# Patient Record
Sex: Male | Born: 1980 | Race: Black or African American | Hispanic: No | Marital: Single | Smoking: Current every day smoker
Health system: Southern US, Community
[De-identification: ages and names within clinical notes are randomized; demographics above are authoritative.]

## PROBLEM LIST (undated history)

## (undated) DIAGNOSIS — I10 Essential (primary) hypertension: Secondary | ICD-10-CM

---

## 1998-08-29 ENCOUNTER — Emergency Department (HOSPITAL_COMMUNITY): Admission: EM | Admit: 1998-08-29 | Discharge: 1998-08-29 | Payer: Self-pay | Admitting: Emergency Medicine

## 1999-10-02 ENCOUNTER — Emergency Department (HOSPITAL_COMMUNITY): Admission: EM | Admit: 1999-10-02 | Discharge: 1999-10-02 | Payer: Self-pay | Admitting: Emergency Medicine

## 1999-10-05 ENCOUNTER — Emergency Department (HOSPITAL_COMMUNITY): Admission: EM | Admit: 1999-10-05 | Discharge: 1999-10-05 | Payer: Self-pay | Admitting: Emergency Medicine

## 1999-10-05 ENCOUNTER — Encounter: Payer: Self-pay | Admitting: Emergency Medicine

## 2001-12-12 ENCOUNTER — Emergency Department (HOSPITAL_COMMUNITY): Admission: EM | Admit: 2001-12-12 | Discharge: 2001-12-12 | Payer: Self-pay | Admitting: Emergency Medicine

## 2002-11-24 ENCOUNTER — Emergency Department (HOSPITAL_COMMUNITY): Admission: EM | Admit: 2002-11-24 | Discharge: 2002-11-24 | Payer: Self-pay | Admitting: Emergency Medicine

## 2003-05-22 ENCOUNTER — Emergency Department (HOSPITAL_COMMUNITY): Admission: EM | Admit: 2003-05-22 | Discharge: 2003-05-22 | Payer: Self-pay | Admitting: Emergency Medicine

## 2003-11-09 ENCOUNTER — Emergency Department (HOSPITAL_COMMUNITY): Admission: EM | Admit: 2003-11-09 | Discharge: 2003-11-09 | Payer: Self-pay | Admitting: *Deleted

## 2004-03-22 ENCOUNTER — Emergency Department (HOSPITAL_COMMUNITY): Admission: EM | Admit: 2004-03-22 | Discharge: 2004-03-22 | Payer: Self-pay | Admitting: *Deleted

## 2004-03-26 ENCOUNTER — Emergency Department (HOSPITAL_COMMUNITY): Admission: EM | Admit: 2004-03-26 | Discharge: 2004-03-26 | Payer: Self-pay | Admitting: Emergency Medicine

## 2006-07-19 ENCOUNTER — Emergency Department (HOSPITAL_COMMUNITY): Admission: EM | Admit: 2006-07-19 | Discharge: 2006-07-19 | Payer: Self-pay | Admitting: Emergency Medicine

## 2009-01-17 ENCOUNTER — Ambulatory Visit: Payer: Self-pay | Admitting: Diagnostic Radiology

## 2009-01-17 ENCOUNTER — Emergency Department (HOSPITAL_BASED_OUTPATIENT_CLINIC_OR_DEPARTMENT_OTHER): Admission: EM | Admit: 2009-01-17 | Discharge: 2009-01-17 | Payer: Self-pay | Admitting: Internal Medicine

## 2010-05-30 ENCOUNTER — Emergency Department (HOSPITAL_COMMUNITY): Admission: EM | Admit: 2010-05-30 | Discharge: 2010-05-30 | Payer: Self-pay | Admitting: Emergency Medicine

## 2011-02-11 ENCOUNTER — Encounter: Payer: Self-pay | Admitting: *Deleted

## 2011-02-11 ENCOUNTER — Emergency Department (HOSPITAL_BASED_OUTPATIENT_CLINIC_OR_DEPARTMENT_OTHER)
Admission: EM | Admit: 2011-02-11 | Discharge: 2011-02-11 | Disposition: A | Discharge status: Home / Self Care | Payer: Self-pay | Attending: Emergency Medicine | Admitting: Emergency Medicine

## 2011-02-11 ENCOUNTER — Emergency Department (INDEPENDENT_AMBULATORY_CARE_PROVIDER_SITE_OTHER): Payer: Self-pay

## 2011-02-11 DIAGNOSIS — M25549 Pain in joints of unspecified hand: Secondary | ICD-10-CM | POA: Insufficient documentation

## 2011-02-11 DIAGNOSIS — F17200 Nicotine dependence, unspecified, uncomplicated: Secondary | ICD-10-CM | POA: Insufficient documentation

## 2011-02-11 DIAGNOSIS — R609 Edema, unspecified: Secondary | ICD-10-CM | POA: Insufficient documentation

## 2011-02-11 DIAGNOSIS — W19XXXA Unspecified fall, initial encounter: Secondary | ICD-10-CM

## 2011-02-11 DIAGNOSIS — M25541 Pain in joints of right hand: Secondary | ICD-10-CM

## 2011-02-11 MED ORDER — IBUPROFEN 800 MG PO TABS: 800.0000 mg | ORAL_TABLET | Freq: Three times a day (TID) | ORAL | Status: AC

## 2011-02-11 MED ORDER — TRAMADOL HCL 50 MG PO TABS: 50.0000 mg | ORAL_TABLET | Freq: Four times a day (QID) | ORAL | Status: AC | PRN
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
## 2011-02-12 NOTE — ED Provider Notes (Signed)
History     Chief Complaint  Patient presents with  . Hand Injury   HPI  Pt fell yesterday and hit this back of his right hand when he hit ground.  Has pain at 3rd and 4th MCP joints.  Pain aggravated by ROM of 3rd and 4th digits and palpation.  Associated w/ edema.  No paresthesias.  Does not have pain anywhere else from fall.  History reviewed. No pertinent past medical history.  History reviewed. No pertinent past surgical history.  History reviewed. No pertinent family history.  History  Substance Use Topics  . Smoking status: Current Everyday Smoker -- 0.5 packs/day  . Smokeless tobacco: Not on file  . Alcohol Use: Yes      Review of Systems  All other systems reviewed and are negative.    Physical Exam  BP 139/87  Pulse 72  Temp(Src) 98.9 F (37.2 C) (Oral)  Resp 18  Ht 5\' 11"  (1.803 m)  Wt 210 lb (95.255 kg)  BMI 29.29 kg/m2  SpO2 99%  Physical Exam  Nursing note and vitals reviewed. Constitutional: He is oriented to person, place, and time. He appears well-developed and well-nourished. No distress.  HENT:  Head: Normocephalic and atraumatic.  Eyes:       Normal appearance  Neck: Normal range of motion.  Musculoskeletal:       Edema and tenderness of and between 3rd and 4th MCP joints of right hand. No deformity or skin changes.  Pain w/ ROM of 3rd and 4th digits.  Distal sensation intact and nml cap refill.  Normal wrist exam.   Neurological: He is alert and oriented to person, place, and time.  Psychiatric: He has a normal mood and affect. His behavior is normal.    ED Course  Procedures  MD Pt present w/ traumatic right hand pain.  Edema and tenderness and painful ROM of 3rd and 4th MCP joints.  NV intact.  Xray neg for fx/dislocation.  Pt declined splint.  Discharged home w/ ibuprofen and ultram for pain.   Recommended RICE.        Otilio Miu, [place] 02/12/11 [phone]

## 2012-08-03 ENCOUNTER — Emergency Department (HOSPITAL_BASED_OUTPATIENT_CLINIC_OR_DEPARTMENT_OTHER)
Admission: EM | Admit: 2012-08-03 | Discharge: 2012-08-03 | Disposition: A | Discharge status: Home / Self Care | Payer: Self-pay | Attending: Emergency Medicine | Admitting: Emergency Medicine

## 2012-08-03 ENCOUNTER — Emergency Department (HOSPITAL_BASED_OUTPATIENT_CLINIC_OR_DEPARTMENT_OTHER): Payer: Self-pay

## 2012-08-03 ENCOUNTER — Encounter (HOSPITAL_BASED_OUTPATIENT_CLINIC_OR_DEPARTMENT_OTHER): Payer: Self-pay

## 2012-08-03 DIAGNOSIS — Y929 Unspecified place or not applicable: Secondary | ICD-10-CM | POA: Insufficient documentation

## 2012-08-03 DIAGNOSIS — S6990XA Unspecified injury of unspecified wrist, hand and finger(s), initial encounter: Secondary | ICD-10-CM | POA: Insufficient documentation

## 2012-08-03 DIAGNOSIS — F17200 Nicotine dependence, unspecified, uncomplicated: Secondary | ICD-10-CM | POA: Insufficient documentation

## 2012-08-03 DIAGNOSIS — M79646 Pain in unspecified finger(s): Secondary | ICD-10-CM

## 2012-08-03 DIAGNOSIS — Y9389 Activity, other specified: Secondary | ICD-10-CM | POA: Insufficient documentation

## 2012-08-03 DIAGNOSIS — IMO0002 Reserved for concepts with insufficient information to code with codable children: Secondary | ICD-10-CM | POA: Insufficient documentation

## 2012-08-03 NOTE — ED Provider Notes (Signed)
History     CSN: [id number]  Arrival date & time 08/03/12  4098   First MD Initiated Contact with Patient 08/03/12 1040      Chief Complaint  Patient presents with  . Hand Injury    (Consider location/radiation/quality/duration/timing/severity/associated sxs/prior treatment) HPI Pt presents with c/o pain in right thumb.  He states that approx 5 days ago he was using his hand to swat a fly and hit the counter.  He states that since that time he has had pain in his hand and a popping in his thumb when he uses it.  He has not taken anything for his symptoms, but has been keeping his hand propped up/elevated at night.  There are no other associated systemic symptoms, there are no other alleviating or modifying factors.   History reviewed. No pertinent past medical history.  History reviewed. No pertinent past surgical history.  History reviewed. No pertinent family history.  History  Substance Use Topics  . Smoking status: Current Every Day Smoker -- 0.5 packs/day    Types: Cigarettes  . Smokeless tobacco: Never Used  . Alcohol Use: Yes     Comment: socially      Review of Systems ROS reviewed and all otherwise negative except for mentioned in HPI  Allergies  Review of patient's allergies indicates no known allergies.  Home Medications  No current outpatient prescriptions on file.  BP 171/104  Pulse 75  Temp 98.6 F (37 C) (Oral)  Resp 20  Ht 5\' 10"  (1.778 m)  Wt 210 lb (95.255 kg)  BMI 30.13 kg/m2  SpO2 100% Vitals reviewed Physical Exam Physical Examination: General appearance - alert, well appearing, and in no distress Mental status - alert, oriented to person, place, and time Chest - clear to auscultation, no wheezes, rales or rhonchi, symmetric air entry Neurological - alert, oriented, normal speech, no focal findings, fingers/thumb distally NVI Musculoskeletal - ttp over MP joint of right thumb, no snuffbox tenderness, no joint tenderness, deformity or  swelling Extremities - peripheral pulses normal, no pedal edema, no clubbing or cyanosis Skin - normal coloration and turgor, no rashes  ED Course  Procedures (including critical care time)  Labs Reviewed - No data to display Dg Hand Complete Right  08/03/2012  *RADIOLOGY REPORT*  Clinical Data: Hand injury  RIGHT HAND - COMPLETE 3+ VIEW  Comparison: 02/11/2011  Findings: There is no evidence of fracture or dislocation.  There is no evidence of arthropathy or other focal bone abnormality. Soft tissues are unremarkable.  IMPRESSION: Negative exam.   Original Report Authenticated By: Signa Kell, M.D.      1. Thumb pain       MDM  Pt presenting with pain in right thumb 5 days after hitting it on a counter when he was swatting a fly.  Xray reassuring- images reviewed by me as well.  Discharged with strict return precautions.  Pt agreeable with plan.        Ethelda Chick, MD 08/03/12 1537
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
## 2012-12-31 ENCOUNTER — Encounter (HOSPITAL_BASED_OUTPATIENT_CLINIC_OR_DEPARTMENT_OTHER): Payer: Self-pay | Admitting: *Deleted

## 2012-12-31 ENCOUNTER — Emergency Department (HOSPITAL_BASED_OUTPATIENT_CLINIC_OR_DEPARTMENT_OTHER)
Admission: EM | Admit: 2012-12-31 | Discharge: 2012-12-31 | Disposition: A | Discharge status: Home / Self Care | Payer: 59 | Attending: Emergency Medicine | Admitting: Emergency Medicine

## 2012-12-31 DIAGNOSIS — R300 Dysuria: Secondary | ICD-10-CM | POA: Insufficient documentation

## 2012-12-31 DIAGNOSIS — S335XXA Sprain of ligaments of lumbar spine, initial encounter: Secondary | ICD-10-CM | POA: Insufficient documentation

## 2012-12-31 DIAGNOSIS — Y9389 Activity, other specified: Secondary | ICD-10-CM | POA: Insufficient documentation

## 2012-12-31 DIAGNOSIS — Z202 Contact with and (suspected) exposure to infections with a predominantly sexual mode of transmission: Secondary | ICD-10-CM

## 2012-12-31 DIAGNOSIS — X500XXA Overexertion from strenuous movement or load, initial encounter: Secondary | ICD-10-CM | POA: Insufficient documentation

## 2012-12-31 DIAGNOSIS — F17200 Nicotine dependence, unspecified, uncomplicated: Secondary | ICD-10-CM | POA: Insufficient documentation

## 2012-12-31 DIAGNOSIS — Y9289 Other specified places as the place of occurrence of the external cause: Secondary | ICD-10-CM | POA: Insufficient documentation

## 2012-12-31 LAB — URINALYSIS, ROUTINE W REFLEX MICROSCOPIC
Glucose, UA: NEGATIVE mg/dL
Ketones, ur: 15 mg/dL — AB
Leukocytes, UA: NEGATIVE
Protein, ur: 30 mg/dL — AB
Urobilinogen, UA: 0.2 mg/dL (ref 0.0–1.0)

## 2012-12-31 LAB — URINE MICROSCOPIC-ADD ON

## 2012-12-31 MED ORDER — LIDOCAINE HCL (PF) 1 % IJ SOLN
INTRAMUSCULAR | Status: AC
  Administered 2012-12-31: 20:00:00
  Filled 2012-12-31: qty 5

## 2012-12-31 MED ORDER — AZITHROMYCIN 250 MG PO TABS
1000.0000 mg | ORAL_TABLET | Freq: Once | ORAL | Status: AC
  Administered 2012-12-31: 1000 mg via ORAL
  Filled 2012-12-31 (×2): qty 4

## 2012-12-31 MED ORDER — CEFTRIAXONE SODIUM 250 MG IJ SOLR
250.0000 mg | Freq: Once | INTRAMUSCULAR | Status: AC
  Administered 2012-12-31: 250 mg via INTRAMUSCULAR
  Filled 2012-12-31 (×2): qty 250

## 2012-12-31 NOTE — ED Provider Notes (Addendum)
History     CSN: [id number]  Arrival date & time 12/31/12  4098   First MD Initiated Contact with Patient 12/31/12 1913      Chief Complaint  Patient presents with  . Back Pain  . Dysuria    (Consider location/radiation/quality/duration/timing/severity/associated sxs/prior treatment) Patient is a 32 y.o. male presenting with back pain and dysuria. The history is provided by the patient.  Back Pain Location:  Lumbar spine Quality:  Aching and stiffness Stiffness is present:  All day Radiates to:  Does not radiate Pain severity:  Mild Onset quality:  Gradual Duration:  1 week Timing:  Intermittent Progression:  Waxing and waning Chronicity:  New Context: lifting heavy objects   Context comment:  Works in [employer] and was shoveling and thinks he strained it Relieved by:  Lying down Worsened by:  Movement Ineffective treatments:  None tried Associated symptoms: dysuria   Associated symptoms: no abdominal pain and no fever   Dysuria This is a new problem. The current episode started 12 to 24 hours ago. The problem occurs constantly. The problem has not changed since onset.Pertinent negatives include no abdominal pain. Associated symptoms comments: Also noticed some mild discharge from penis today.  New sexual partner and condom broke. Exacerbated by: urinating. Nothing relieves the symptoms. He has tried nothing for the symptoms. The treatment provided no relief.    History reviewed. No pertinent past medical history.  History reviewed. No pertinent past surgical history.  History reviewed. No pertinent family history.  History  Substance Use Topics  . Smoking status: Current Every Day Smoker -- 0.50 packs/day  . Smokeless tobacco: Never Used  . Alcohol Use: Yes     Comment: socially      Review of Systems  Constitutional: Negative for fever.  Gastrointestinal: Negative for abdominal pain.  Genitourinary: Positive for dysuria.  Musculoskeletal: Positive for  back pain.  All other systems reviewed and are negative.    Allergies  Review of patient's allergies indicates no known allergies.  Home Medications  No current outpatient prescriptions on file.  BP 152/97  Pulse 75  Temp(Src) 98.8 F (37.1 C) (Oral)  Resp 16  Ht 5\' 11"  (1.803 m)  Wt 186 lb (84.369 kg)  BMI 25.95 kg/m2  SpO2 100%  Physical Exam  Nursing note and vitals reviewed. Constitutional: He is oriented to person, place, and time. He appears well-developed and well-nourished. No distress.  HENT:  Head: Normocephalic and atraumatic.  Mouth/Throat: Oropharynx is clear and moist.  Eyes: Conjunctivae and EOM are normal. Pupils are equal, round, and reactive to light.  Neck: Normal range of motion. Neck supple.  Cardiovascular: Normal rate, regular rhythm and intact distal pulses.   No murmur heard. Pulmonary/Chest: Effort normal and breath sounds normal. No respiratory distress. He has no wheezes. He has no rales.  Abdominal: Soft. He exhibits no distension. There is no tenderness. There is no rebound and no guarding.  Genitourinary: Testes normal and penis normal. No penile tenderness. No discharge found.  Musculoskeletal: Normal range of motion. He exhibits no edema and no tenderness.  Neurological: He is alert and oriented to person, place, and time.  Skin: Skin is warm and dry. No rash noted. No erythema.  Psychiatric: He has a normal mood and affect. His behavior is normal.    ED Course  Procedures (including critical care time)  Labs Reviewed  URINALYSIS, ROUTINE W REFLEX MICROSCOPIC - Abnormal; Notable for the following:    Specific Gravity, Urine 1.041 (*)  Hgb urine dipstick LARGE (*)    Ketones, ur 15 (*)    Protein, ur 30 (*)    All other components within normal limits  URINE MICROSCOPIC-ADD ON - Abnormal; Notable for the following:    Bacteria, UA FEW (*)    All other components within normal limits  GC/CHLAMYDIA PROBE AMP   No results  found.   1. Possible exposure to STD       MDM   Patient with dysuria for the last few days as well as some mild discharge. He states that he sexually active with a new partner and the condom did break. He also states he's also had some mild lower back pain for the last one week he works in [employer] and feels like he pulled his back.  Feel that the tumor unrelated. Patient does have some blood in the urine this could be from an early urethritis. Percent of Trichomonas or UTI. Patient does not have flank tenderness but has lower left paralumbar tenderness.  Patient treated with Rocephin and azithromycin. STD cultures sent.        Gwyneth Sprout, MD 12/31/12 0454  Gwyneth Sprout, MD 12/31/12 Barry Brunner

## 2012-12-31 NOTE — ED Notes (Signed)
Pt c/o lower left back pain and painful urination x 1 day

## 2013-01-02 LAB — GC/CHLAMYDIA PROBE AMP: GC Probe RNA: NEGATIVE

## 2013-01-03 ENCOUNTER — Telehealth (HOSPITAL_COMMUNITY): Payer: Self-pay | Admitting: Emergency Medicine

## 2013-01-03 NOTE — ED Notes (Signed)
+  Chlamydia. Patient treated with Rocephin and Zithromax. [facility] faxed. 

## 2013-01-03 NOTE — ED Notes (Signed)
Patient has +Chlamydia. 

## 2013-01-04 ENCOUNTER — Telehealth (HOSPITAL_COMMUNITY): Payer: Self-pay | Admitting: Emergency Medicine

## 2013-01-05 ENCOUNTER — Telehealth (HOSPITAL_COMMUNITY): Payer: Self-pay | Admitting: Emergency Medicine

## 2013-01-06 ENCOUNTER — Telehealth (HOSPITAL_COMMUNITY): Payer: Self-pay | Admitting: Emergency Medicine

## 2013-01-06 NOTE — ED Notes (Signed)
Unable to contact patient via phone. Sent letter. °

## 2013-01-31 ENCOUNTER — Telehealth (HOSPITAL_COMMUNITY): Payer: Self-pay | Admitting: Emergency Medicine

## 2013-01-31 NOTE — ED Notes (Signed)
No response to letter sent after 30 days. Chart sent to Medical Records. °

## 2013-02-23 ENCOUNTER — Emergency Department (HOSPITAL_BASED_OUTPATIENT_CLINIC_OR_DEPARTMENT_OTHER)
Admission: EM | Admit: 2013-02-23 | Discharge: 2013-02-23 | Disposition: A | Discharge status: Home / Self Care | Payer: Self-pay | Attending: Emergency Medicine | Admitting: Emergency Medicine

## 2013-02-23 ENCOUNTER — Encounter (HOSPITAL_BASED_OUTPATIENT_CLINIC_OR_DEPARTMENT_OTHER): Payer: Self-pay | Admitting: *Deleted

## 2013-02-23 DIAGNOSIS — Z202 Contact with and (suspected) exposure to infections with a predominantly sexual mode of transmission: Secondary | ICD-10-CM | POA: Insufficient documentation

## 2013-02-23 DIAGNOSIS — F17200 Nicotine dependence, unspecified, uncomplicated: Secondary | ICD-10-CM | POA: Insufficient documentation

## 2013-02-23 DIAGNOSIS — R369 Urethral discharge, unspecified: Secondary | ICD-10-CM | POA: Insufficient documentation

## 2013-02-23 LAB — URINALYSIS, ROUTINE W REFLEX MICROSCOPIC
Ketones, ur: NEGATIVE mg/dL
Nitrite: NEGATIVE
Protein, ur: NEGATIVE mg/dL

## 2013-02-23 LAB — URINE MICROSCOPIC-ADD ON

## 2013-02-23 MED ORDER — AZITHROMYCIN 250 MG PO TABS
1000.0000 mg | ORAL_TABLET | Freq: Once | ORAL | Status: AC
  Administered 2013-02-23: 1000 mg via ORAL
  Filled 2013-02-23: qty 4

## 2013-02-23 MED ORDER — CEFTRIAXONE SODIUM 250 MG IJ SOLR
250.0000 mg | Freq: Once | INTRAMUSCULAR | Status: AC
  Administered 2013-02-23: 250 mg via INTRAMUSCULAR
  Filled 2013-02-23: qty 250

## 2013-02-23 NOTE — ED Notes (Signed)
Pt states he wants to be checked for std "just to be sure" no "real " symptoms states had intercourse with someone he knows has had "it" before and occasionally has a burning on urination

## 2013-02-23 NOTE — ED Provider Notes (Signed)
  CSN: [id number]     Arrival date & time 02/23/13  1048 History     First MD Initiated Contact with Patient 02/23/13 1052     Chief Complaint  Patient presents with  . check for std    (Consider location/radiation/quality/duration/timing/severity/associated sxs/prior Treatment) Patient is a 32 y.o. male presenting with STD exposure. The history is provided by the patient.  Exposure to STD This is a recurrent problem. The current episode started 2 days ago. The problem occurs constantly. The problem has not changed since onset.Pertinent negatives include no abdominal pain. Exacerbated by: urinating. Nothing relieves the symptoms. He has tried nothing for the symptoms. The treatment provided no relief.    History reviewed. No pertinent past medical history. History reviewed. No pertinent past surgical history. History reviewed. No pertinent family history. History  Substance Use Topics  . Smoking status: Current Every Day Smoker -- 0.50 packs/day  . Smokeless tobacco: Never Used  . Alcohol Use: Yes     Comment: socially    Review of Systems  Gastrointestinal: Negative for abdominal pain.  All other systems reviewed and are negative.    Allergies  Review of patient's allergies indicates no known allergies.  Home Medications  No current outpatient prescriptions on file. BP 152/98  Pulse 84  Temp(Src) 98.3 F (36.8 C) (Oral)  Resp 16  Ht 5\' 11"  (1.803 m)  Wt 190 lb (86.183 kg)  BMI 26.51 kg/m2  SpO2 100% Physical Exam  Nursing note and vitals reviewed. Constitutional: He is oriented to person, place, and time. He appears well-developed and well-nourished. No distress.  HENT:  Head: Normocephalic and atraumatic.  Mouth/Throat: Oropharynx is clear and moist.  Neck: Normal range of motion. Neck supple.  Abdominal: Soft. Bowel sounds are normal.  Genitourinary: Penis normal. No penile tenderness.  Slight clear urethral discharge present.  Musculoskeletal: Normal range of  motion.  Neurological: He is alert and oriented to person, place, and time.  Skin: Skin is warm and dry. He is not diaphoretic.    ED Course   Procedures (including critical care time)  Labs Reviewed  GC/CHLAMYDIA PROBE AMP  URINALYSIS, ROUTINE W REFLEX MICROSCOPIC   No results found. No diagnosis found.  MDM  Rocephin, zmax given.  WBC's in the urine.+  Geoffery Lyons, MD 02/23/13 1136

## 2013-02-25 NOTE — ED Notes (Signed)
+   Chlamydia Patient treated with Rocephin And Zithromax- 

## 2013-02-26 ENCOUNTER — Telehealth (HOSPITAL_COMMUNITY): Payer: Self-pay | Admitting: Emergency Medicine

## 2013-02-26 NOTE — ED Notes (Signed)
Patient has +Chalmydia.

## 2013-02-26 NOTE — ED Notes (Signed)
+  Clamydia. Patient treated with Rocephin and Zithromax. [facility] faxed.

## 2013-03-01 ENCOUNTER — Telehealth (HOSPITAL_COMMUNITY): Payer: Self-pay | Admitting: Emergency Medicine

## 2013-03-26 ENCOUNTER — Emergency Department (HOSPITAL_BASED_OUTPATIENT_CLINIC_OR_DEPARTMENT_OTHER)
Admission: EM | Admit: 2013-03-26 | Discharge: 2013-03-26 | Disposition: A | Discharge status: Home / Self Care | Payer: 59 | Attending: Emergency Medicine | Admitting: Emergency Medicine

## 2013-03-26 ENCOUNTER — Encounter (HOSPITAL_BASED_OUTPATIENT_CLINIC_OR_DEPARTMENT_OTHER): Payer: Self-pay | Admitting: *Deleted

## 2013-03-26 DIAGNOSIS — Y9389 Activity, other specified: Secondary | ICD-10-CM | POA: Insufficient documentation

## 2013-03-26 DIAGNOSIS — Y92410 Unspecified street and highway as the place of occurrence of the external cause: Secondary | ICD-10-CM | POA: Insufficient documentation

## 2013-03-26 DIAGNOSIS — S139XXA Sprain of joints and ligaments of unspecified parts of neck, initial encounter: Secondary | ICD-10-CM | POA: Insufficient documentation

## 2013-03-26 DIAGNOSIS — F17200 Nicotine dependence, unspecified, uncomplicated: Secondary | ICD-10-CM | POA: Insufficient documentation

## 2013-03-26 MED ORDER — METHOCARBAMOL 500 MG PO TABS: 500.0000 mg | ORAL_TABLET | Freq: Two times a day (BID) | ORAL | Status: DC

## 2013-03-26 MED ORDER — HYDROCODONE-ACETAMINOPHEN 5-325 MG PO TABS: 2.0000 | ORAL_TABLET | ORAL | Status: DC | PRN

## 2013-03-26 MED ORDER — IBUPROFEN 800 MG PO TABS: 800.0000 mg | ORAL_TABLET | Freq: Three times a day (TID) | ORAL | Status: DC

## 2013-03-26 NOTE — ED Notes (Signed)
MD at bedside. 

## 2013-03-26 NOTE — ED Notes (Signed)
PA at bedside.
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
## 2013-03-28 NOTE — ED Provider Notes (Signed)
Medical screening examination/treatment/procedure(s) were performed by non-physician practitioner and as supervising physician I was immediately available for consultation/collaboration.  Derwood Kaplan, MD 03/28/13 [phone]

## 2013-05-03 ENCOUNTER — Encounter (HOSPITAL_COMMUNITY): Payer: Self-pay | Admitting: Emergency Medicine

## 2013-05-03 ENCOUNTER — Emergency Department (HOSPITAL_COMMUNITY)
Admission: EM | Admit: 2013-05-03 | Discharge: 2013-05-03 | Disposition: A | Discharge status: Home / Self Care | Payer: 59 | Attending: Emergency Medicine | Admitting: Emergency Medicine

## 2013-05-03 DIAGNOSIS — Z791 Long term (current) use of non-steroidal anti-inflammatories (NSAID): Secondary | ICD-10-CM | POA: Insufficient documentation

## 2013-05-03 DIAGNOSIS — K089 Disorder of teeth and supporting structures, unspecified: Secondary | ICD-10-CM | POA: Insufficient documentation

## 2013-05-03 DIAGNOSIS — K0889 Other specified disorders of teeth and supporting structures: Secondary | ICD-10-CM

## 2013-05-03 DIAGNOSIS — F17200 Nicotine dependence, unspecified, uncomplicated: Secondary | ICD-10-CM | POA: Insufficient documentation

## 2013-05-03 DIAGNOSIS — K029 Dental caries, unspecified: Secondary | ICD-10-CM | POA: Insufficient documentation

## 2013-05-03 MED ORDER — HYDROCODONE-ACETAMINOPHEN 5-325 MG PO TABS: 1.0000 | ORAL_TABLET | ORAL | Status: DC | PRN

## 2013-05-03 MED ORDER — PENICILLIN V POTASSIUM 500 MG PO TABS: 500.0000 mg | ORAL_TABLET | Freq: Three times a day (TID) | ORAL | Status: DC

## 2013-05-03 NOTE — ED Notes (Signed)
Pt from home reports R upper tooth pain x several months. Pt states he is getting no sleep and OTC meds are not helping. Pt is A&O and In NAD

## 2013-05-03 NOTE — ED Provider Notes (Signed)
CSN: [id number]     Arrival date & time 05/03/13  1653 History   First MD Initiated Contact with Patient 05/03/13 1735     This chart was scribed for Shanna Cisco, MD by Ladona Ridgel Day, ED scribe. This patient was seen in room WTR6/WTR6 and the patient's care was started at 1653.  Chief Complaint  Patient presents with  . Dental Pain   Patient is a 32 y.o. male presenting with tooth pain. The history is provided by the patient. No language interpreter was used.  Dental Pain Location:  Upper Quality:  Aching Severity:  Moderate Onset quality:  Gradual Timing:  Constant Progression:  Worsening Chronicity:  Chronic Relieved by:  Nothing Worsened by:  Nothing tried Ineffective treatments:  NSAIDs Associated symptoms: no congestion, no facial swelling, no fever and no neck pain    HPI Comments: ANTJUAN ROTHE is a 32 y.o. male who presents to the Emergency Department complaining of constant, gradually worsening, chronic right upper tooth pain. He states no relief with ibuprofen. He denies nausea, emesis, fever/chills. He states not taking any antibiotics currently and does not have a dentist.    History reviewed. No pertinent past medical history. History reviewed. No pertinent past surgical history. No family history on file. History  Substance Use Topics  . Smoking status: Current Every Day Smoker -- 0.50 packs/day    Types: Cigarettes  . Smokeless tobacco: Never Used  . Alcohol Use: Yes     Comment: socially    Review of Systems  Constitutional: Negative for fever and chills.  HENT: Positive for dental problem. Negative for congestion and facial swelling.   Respiratory: Negative for shortness of breath.   Gastrointestinal: Negative for nausea and vomiting.  Musculoskeletal: Negative for neck pain.  Neurological: Negative for weakness.  All other systems reviewed and are negative.   A complete 10 system review of systems was obtained and all systems are negative except as  noted in the HPI and PMH.   Allergies  Review of patient's allergies indicates no known allergies.  Home Medications   Current Outpatient Rx  Name  Route  Sig  Dispense  Refill  . ibuprofen (ADVIL,MOTRIN) 800 MG tablet   Oral   Take 1 tablet (800 mg total) by mouth 3 (three) times daily.   21 tablet   0    Triage Vitals: BP 137/93  Pulse 67  Temp(Src) 98.3 F (36.8 C) (Oral)  Resp 16  SpO2 100% Physical Exam  Nursing note and vitals reviewed. Constitutional: He is oriented to person, place, and time. He appears well-developed and well-nourished. No distress.  HENT:  Head: Normocephalic and atraumatic.  Mouth/Throat: Oropharynx is clear and moist. Dental caries present.    Eyes: Conjunctivae and EOM are normal. Pupils are equal, round, and reactive to light.  Neck: Normal range of motion. Neck supple. No tracheal deviation present.  Cardiovascular: Normal rate and regular rhythm.   Pulmonary/Chest: Effort normal and breath sounds normal. No respiratory distress.  Musculoskeletal: Normal range of motion.  Neurological: He is alert and oriented to person, place, and time.  Skin: Skin is warm and dry.  Psychiatric: He has a normal mood and affect. His behavior is normal.    ED Course  Procedures (including critical care time) DIAGNOSTIC STUDIES: Oxygen Saturation is 100% on room air, normal by my interpretation.    COORDINATION OF CARE: At 615 PM Discussed treatment plan with patient which includes pain medicine, antibiotics. Patient agrees.   Labs  Review Labs Reviewed - No data to display Imaging Review No results found.  EKG Interpretation   None       MDM  No diagnosis found. Dx: dental pain  Patient has dental pain. No emergent s/sx's present. Patent airway. No trismus.  Will be given pain medication and antibiotics. I discussed the need to call dentist within 24/48 hours for follow-up. Dental referral given. Return to ED precautions given.  Pt  voiced understanding and has agreed to follow-up.   32 y.o.Anna Genre Covington's evaluation in the Emergency Department is complete. It has been determined that no acute conditions requiring further emergency intervention are present at this time. The patient/guardian have been advised of the diagnosis and plan. We have discussed signs and symptoms that warrant return to the ED, such as changes or worsening in symptoms.  Vital signs are stable at discharge. Filed Vitals:   05/03/13 1730  BP: 137/93  Pulse: 67  Temp: 98.3 F (36.8 C)  Resp: 16    Patient/guardian has voiced understanding and agreed to follow-up with the PCP or specialist.  I personally performed the services described in this documentation, which was scribed in my presence. The recorded information has been reviewed and is accurate.      Dorthula Matas, PA-C 05/03/13 Rickey Primus

## 2013-05-03 NOTE — ED Provider Notes (Signed)
Medical screening examination/treatment/procedure(s) were performed by non-physician practitioner and as supervising physician I was immediately available for consultation/collaboration.  Lael Wetherbee E Phoenyx Melka, MD 05/03/13 2233 

## 2013-12-26 ENCOUNTER — Emergency Department (HOSPITAL_BASED_OUTPATIENT_CLINIC_OR_DEPARTMENT_OTHER)
Admission: EM | Admit: 2013-12-26 | Discharge: 2013-12-26 | Discharge status: Against Medical Advice | Payer: No Typology Code available for payment source | Attending: Emergency Medicine | Admitting: Emergency Medicine

## 2013-12-26 ENCOUNTER — Emergency Department (HOSPITAL_COMMUNITY)
Admission: EM | Admit: 2013-12-26 | Discharge: 2013-12-27 | Disposition: A | Discharge status: Home / Self Care | Payer: No Typology Code available for payment source | Attending: Emergency Medicine | Admitting: Emergency Medicine

## 2013-12-26 ENCOUNTER — Encounter (HOSPITAL_COMMUNITY): Payer: Self-pay | Admitting: Emergency Medicine

## 2013-12-26 ENCOUNTER — Encounter (HOSPITAL_BASED_OUTPATIENT_CLINIC_OR_DEPARTMENT_OTHER): Payer: Self-pay | Admitting: Emergency Medicine

## 2013-12-26 DIAGNOSIS — Y92410 Unspecified street and highway as the place of occurrence of the external cause: Secondary | ICD-10-CM | POA: Insufficient documentation

## 2013-12-26 DIAGNOSIS — S4980XA Other specified injuries of shoulder and upper arm, unspecified arm, initial encounter: Secondary | ICD-10-CM | POA: Insufficient documentation

## 2013-12-26 DIAGNOSIS — S161XXA Strain of muscle, fascia and tendon at neck level, initial encounter: Secondary | ICD-10-CM

## 2013-12-26 DIAGNOSIS — S46909A Unspecified injury of unspecified muscle, fascia and tendon at shoulder and upper arm level, unspecified arm, initial encounter: Secondary | ICD-10-CM | POA: Insufficient documentation

## 2013-12-26 DIAGNOSIS — Y9389 Activity, other specified: Secondary | ICD-10-CM | POA: Insufficient documentation

## 2013-12-26 DIAGNOSIS — S199XXA Unspecified injury of neck, initial encounter: Secondary | ICD-10-CM

## 2013-12-26 DIAGNOSIS — F17200 Nicotine dependence, unspecified, uncomplicated: Secondary | ICD-10-CM | POA: Insufficient documentation

## 2013-12-26 DIAGNOSIS — S0993XA Unspecified injury of face, initial encounter: Secondary | ICD-10-CM | POA: Insufficient documentation

## 2013-12-26 DIAGNOSIS — S0990XA Unspecified injury of head, initial encounter: Secondary | ICD-10-CM | POA: Insufficient documentation

## 2013-12-26 DIAGNOSIS — S139XXA Sprain of joints and ligaments of unspecified parts of neck, initial encounter: Secondary | ICD-10-CM | POA: Insufficient documentation
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
## 2013-12-26 NOTE — ED Notes (Signed)
Patient does not want to wait, aware of department business
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
## 2013-12-27 ENCOUNTER — Emergency Department (HOSPITAL_COMMUNITY): Payer: No Typology Code available for payment source

## 2013-12-27 MED ORDER — IBUPROFEN 800 MG PO TABS
800.0000 mg | ORAL_TABLET | Freq: Once | ORAL | Status: AC
  Administered 2013-12-27: 800 mg via ORAL
  Filled 2013-12-27: qty 1

## 2013-12-27 MED ORDER — HYDROCODONE-ACETAMINOPHEN 5-325 MG PO TABS
1.0000 | ORAL_TABLET | Freq: Once | ORAL | Status: AC
  Administered 2013-12-27: 1 via ORAL
  Filled 2013-12-27: qty 1

## 2013-12-27 MED ORDER — IBUPROFEN 800 MG PO TABS: 800.0000 mg | ORAL_TABLET | Freq: Three times a day (TID) | ORAL | Status: DC | PRN

## 2013-12-27 MED ORDER — HYDROCODONE-ACETAMINOPHEN 5-325 MG PO TABS: 1.0000 | ORAL_TABLET | Freq: Four times a day (QID) | ORAL | Status: DC | PRN

## 2013-12-27 NOTE — Discharge Instructions (Signed)
Return here as needed.  Use ice and heat on your neck.  Followup with urgent care or primary care Dr.

## 2013-12-28 NOTE — ED Provider Notes (Signed)
Medical screening examination/treatment/procedure(s) were performed by non-physician practitioner and as supervising physician I was immediately available for consultation/collaboration.   EKG Interpretation None       Shivonne Schwartzman M Marbin Olshefski, MD 12/28/13 0453 

## 2014-05-02 ENCOUNTER — Emergency Department (HOSPITAL_BASED_OUTPATIENT_CLINIC_OR_DEPARTMENT_OTHER)
Admission: EM | Admit: 2014-05-02 | Discharge: 2014-05-02 | Disposition: A | Discharge status: Home / Self Care | Payer: 59 | Attending: Emergency Medicine | Admitting: Emergency Medicine

## 2014-05-02 ENCOUNTER — Encounter (HOSPITAL_BASED_OUTPATIENT_CLINIC_OR_DEPARTMENT_OTHER): Payer: Self-pay | Admitting: Emergency Medicine

## 2014-05-02 DIAGNOSIS — Z720 Tobacco use: Secondary | ICD-10-CM | POA: Insufficient documentation

## 2014-05-02 DIAGNOSIS — I10 Essential (primary) hypertension: Secondary | ICD-10-CM | POA: Insufficient documentation

## 2014-05-02 DIAGNOSIS — Z791 Long term (current) use of non-steroidal anti-inflammatories (NSAID): Secondary | ICD-10-CM | POA: Insufficient documentation

## 2014-05-02 HISTORY — DX: Essential (primary) hypertension: I10

## 2014-05-02 LAB — BASIC METABOLIC PANEL
Anion gap: 11 (ref 5–15)
BUN: 11 mg/dL (ref 6–23)
CO2: 27 mEq/L (ref 19–32)
CREATININE: 1 mg/dL (ref 0.50–1.35)
Calcium: 9.2 mg/dL (ref 8.4–10.5)
Chloride: 102 mEq/L (ref 96–112)
GLUCOSE: 108 mg/dL — AB (ref 70–99)
Potassium: 4.1 mEq/L (ref 3.7–5.3)
Sodium: 140 mEq/L (ref 137–147)

## 2014-05-02 MED ORDER — LISINOPRIL 20 MG PO TABS: 20.0000 mg | ORAL_TABLET | Freq: Every day | ORAL | Status: DC

## 2014-05-02 MED ORDER — HYDROCODONE-ACETAMINOPHEN 5-325 MG PO TABS: 2.0000 | ORAL_TABLET | ORAL | Status: DC | PRN

## 2014-05-02 NOTE — ED Notes (Signed)
Pt reports headache off in and on for two weeks.  Reports frontal/left temporal headache.  Denies vision changes or nausea vomiting.

## 2014-05-02 NOTE — Discharge Instructions (Signed)
DASH Eating Plan DASH stands for "Dietary Approaches to Stop Hypertension." The DASH eating plan is a healthy eating plan that has been shown to reduce high blood pressure (hypertension). Additional health benefits may include reducing the risk of type 2 diabetes mellitus, heart disease, and stroke. The DASH eating plan may also help with weight loss. WHAT DO I NEED TO KNOW ABOUT THE DASH EATING PLAN? For the DASH eating plan, you will follow these general guidelines:  Choose foods with a percent daily value for sodium of less than 5% (as listed on the food label).  Use salt-free seasonings or herbs instead of table salt or sea salt.  Check with your health care provider or pharmacist before using salt substitutes.  Eat lower-sodium products, often labeled as "lower sodium" or "no salt added."  Eat fresh foods.  Eat more vegetables, fruits, and low-fat dairy products.  Choose whole grains. Look for the word "whole" as the first word in the ingredient list.  Choose fish and skinless chicken or turkey more often than red meat. Limit fish, poultry, and meat to 6 oz (170 g) each day.  Limit sweets, desserts, sugars, and sugary drinks.  Choose heart-healthy fats.  Limit cheese to 1 oz (28 g) per day.  Eat more home-cooked food and less restaurant, buffet, and fast food.  Limit fried foods.  Cook foods using methods other than frying.  Limit canned vegetables. If you do use them, rinse them well to decrease the sodium.  When eating at a restaurant, ask that your food be prepared with less salt, or no salt if possible. WHAT FOODS CAN I EAT? Seek help from a dietitian for individual calorie needs. Grains Whole grain or whole wheat bread. Brown rice. Whole grain or whole wheat pasta. Quinoa, bulgur, and whole grain cereals. Low-sodium cereals. Corn or whole wheat flour tortillas. Whole grain cornbread. Whole grain crackers. Low-sodium crackers. Vegetables Fresh or frozen vegetables  (raw, steamed, roasted, or grilled). Low-sodium or reduced-sodium tomato and vegetable juices. Low-sodium or reduced-sodium tomato sauce and paste. Low-sodium or reduced-sodium canned vegetables.  Fruits All fresh, canned (in natural juice), or frozen fruits. Meat and Other Protein Products Ground beef (85% or leaner), grass-fed beef, or beef trimmed of fat. Skinless chicken or turkey. Ground chicken or turkey. Pork trimmed of fat. All fish and seafood. Eggs. Dried beans, peas, or lentils. Unsalted nuts and seeds. Unsalted canned beans. Dairy Low-fat dairy products, such as skim or 1% milk, 2% or reduced-fat cheeses, low-fat ricotta or cottage cheese, or plain low-fat yogurt. Low-sodium or reduced-sodium cheeses. Fats and Oils Tub margarines without trans fats. Light or reduced-fat mayonnaise and salad dressings (reduced sodium). Avocado. Safflower, olive, or canola oils. Natural peanut or almond butter. Other Unsalted popcorn and pretzels. The items listed above may not be a complete list of recommended foods or beverages. Contact your dietitian for more options. WHAT FOODS ARE NOT RECOMMENDED? Grains White bread. White pasta. White rice. Refined cornbread. Bagels and croissants. Crackers that contain trans fat. Vegetables Creamed or fried vegetables. Vegetables in a cheese sauce. Regular canned vegetables. Regular canned tomato sauce and paste. Regular tomato and vegetable juices. Fruits Dried fruits. Canned fruit in light or heavy syrup. Fruit juice. Meat and Other Protein Products Fatty cuts of meat. Ribs, chicken wings, bacon, sausage, bologna, salami, chitterlings, fatback, hot dogs, bratwurst, and packaged luncheon meats. Salted nuts and seeds. Canned beans with salt. Dairy Whole or 2% milk, cream, half-and-half, and cream cheese. Whole-fat or sweetened yogurt. Full-fat   cheeses or blue cheese. Nondairy creamers and whipped toppings. Processed cheese, cheese spreads, or cheese  curds. Condiments Onion and garlic salt, seasoned salt, table salt, and sea salt. Canned and packaged gravies. Worcestershire sauce. Tartar sauce. Barbecue sauce. Teriyaki sauce. Soy sauce, including reduced sodium. Steak sauce. Fish sauce. Oyster sauce. Cocktail sauce. Horseradish. Ketchup and mustard. Meat flavorings and tenderizers. Bouillon cubes. Hot sauce. Tabasco sauce. Marinades. Taco seasonings. Relishes. Fats and Oils Butter, stick margarine, lard, shortening, ghee, and bacon fat. Coconut, palm kernel, or palm oils. Regular salad dressings. Other Pickles and olives. Salted popcorn and pretzels. The items listed above may not be a complete list of foods and beverages to avoid. Contact your dietitian for more information. WHERE CAN I FIND MORE INFORMATION? National Heart, Lung, and [employer] Institute: www.nhlbi.nih.gov/health/health-topics/topics/dash/ Document Released: 06/28/2011 Document Revised: 11/23/2013 Document Reviewed: 05/13/2013 ExitCare Patient Information 2015 [employer]. This information is not intended to replace advice given to you by your health care provider. Make sure you discuss any questions you have with your health care provider. Hypertension Hypertension, commonly called high blood pressure, is when the force of blood pumping through your arteries is too strong. Your arteries are the blood vessels that carry blood from your heart throughout your body. A blood pressure reading consists of a higher number over a lower number, such as 110/72. The higher number (systolic) is the pressure inside your arteries when your heart pumps. The lower number (diastolic) is the pressure inside your arteries when your heart relaxes. Ideally you want your blood pressure below 120/80. Hypertension forces your heart to work harder to pump blood. Your arteries may become narrow or stiff. Having hypertension puts you at risk for heart disease, stroke, and other problems.  RISK  FACTORS Some risk factors for high blood pressure are controllable. Others are not.  Risk factors you cannot control include:   Race. You may be at higher risk if you are African American.  Age. Risk increases with age.  Gender. Men are at higher risk than women before age 45 years. After age 65, women are at higher risk than men. Risk factors you can control include:  Not getting enough exercise or physical activity.  Being overweight.  Getting too much fat, sugar, calories, or salt in your diet.  Drinking too much alcohol. SIGNS AND SYMPTOMS Hypertension does not usually cause signs or symptoms. Extremely high blood pressure (hypertensive crisis) may cause headache, anxiety, shortness of breath, and nosebleed. DIAGNOSIS  To check if you have hypertension, your health care provider will measure your blood pressure while you are seated, with your arm held at the level of your heart. It should be measured at least twice using the same arm. Certain conditions can cause a difference in blood pressure between your right and left arms. A blood pressure reading that is higher than normal on one occasion does not mean that you need treatment. If one blood pressure reading is high, ask your health care provider about having it checked again. TREATMENT  Treating high blood pressure includes making lifestyle changes and possibly taking medicine. Living a healthy lifestyle can help lower high blood pressure. You may need to change some of your habits. Lifestyle changes may include:  Following the DASH diet. This diet is high in fruits, vegetables, and whole grains. It is low in salt, red meat, and added sugars.  Getting at least 2 hours of brisk physical activity every week.  Losing weight if necessary.  Not smoking.  Limiting   alcoholic beverages.  Learning ways to reduce stress. If lifestyle changes are not enough to get your blood pressure under control, your health care provider may  prescribe medicine. You may need to take more than one. Work closely with your health care provider to understand the risks and benefits. HOME CARE INSTRUCTIONS  Have your blood pressure rechecked as directed by your health care provider.   Take medicines only as directed by your health care provider. Follow the directions carefully. Blood pressure medicines must be taken as prescribed. The medicine does not work as well when you skip doses. Skipping doses also puts you at risk for problems.   Do not smoke.   Monitor your blood pressure at home as directed by your health care provider. SEEK MEDICAL CARE IF:   You think you are having a reaction to medicines taken.  You have recurrent headaches or feel dizzy.  You have swelling in your ankles.  You have trouble with your vision. SEEK IMMEDIATE MEDICAL CARE IF:  You develop a severe headache or confusion.  You have unusual weakness, numbness, or feel faint.  You have severe chest or abdominal pain.  You vomit repeatedly.  You have trouble breathing. MAKE SURE YOU:   Understand these instructions.  Will watch your condition.  Will get help right away if you are not doing well or get worse. Document Released: 07/09/2005 Document Revised: 11/23/2013 Document Reviewed: 05/01/2013 ExitCare Patient Information 2015 [employer]. This information is not intended to replace advice given to you by your health care provider. Make sure you discuss any questions you have with your health care provider.  

## 2014-05-02 NOTE — ED Provider Notes (Signed)
CSN: [id number]     Arrival date & time 05/02/14  1910 History  This chart was scribed for Eric Blanchard Anginette Espejo, MD by Annye AsaAnna Dorsett, ED Scribe. This patient was seen in room MH01/MH01 and the patient's care was started at 9:16 PM.    Chief Complaint  Patient presents with  . Headache   The history is provided by the patient. No language interpreter was used.    HPI Comments: Eric Blanchard is a 33 y.o. male who presents to the Emergency Department complaining of 2 weeks of intermittent headache, localized in the front of his head and his left temple, with associated lightheadedness. He is unsure of these headaches are due to stress; he reports that his headaches seem to come on particularly after eating spicy foods, making him believe that the headaches might be related to his blood pressure. He denies vision changes, nausea, or vomiting.   He does not take any medications at this time. He is currently a .5ppd smoker.    Past Medical History  Diagnosis Date  . Hypertension    History reviewed. No pertinent past surgical history. No family history on file. History  Substance Use Topics  . Smoking status: Current Every Day Smoker -- 0.50 packs/day    Types: Cigarettes  . Smokeless tobacco: Never Used  . Alcohol Use: Yes     Comment: socially    Review of Systems  A complete 10 system review of systems was obtained and all systems are negative except as noted in the HPI and PMH.    Allergies  Review of patient's allergies indicates no known allergies.  Home Medications   Prior to Admission medications   Medication Sig Start Date End Date Taking? Authorizing Provider  HYDROcodone-acetaminophen (NORCO/VICODIN) 5-325 MG per tablet Take 1 tablet by mouth every 6 (six) hours as needed for moderate pain. 12/27/13   Carlyle Dollyhristopher W Lawyer, PA-C  HYDROcodone-acetaminophen (NORCO/VICODIN) 5-325 MG per tablet Take 2 tablets by mouth every 4 (four) hours as needed for moderate pain or severe pain.  05/02/14   Eric Blanchard Berlynn Warsame, MD  ibuprofen (ADVIL,MOTRIN) 800 MG tablet Take 1 tablet (800 mg total) by mouth every 8 (eight) hours as needed. 12/27/13   Jamesetta Orleanshristopher W Lawyer, PA-C  lisinopril (PRINIVIL,ZESTRIL) 20 MG tablet Take 1 tablet (20 mg total) by mouth daily. 05/02/14   Eric Blanchard Kirsi Hugh, MD   BP 140/90  Pulse 77  Temp(Src) 98 F (36.7 C) (Oral)  Resp 16  Ht 5\' 11"  (1.803 m)  Wt 200 lb (90.719 kg)  BMI 27.91 kg/m2  SpO2 100% Physical Exam Physical Exam  Nursing note and vitals reviewed. Constitutional: He is oriented to person, place, and time. He appears well-developed and well-nourished. No distress.  HENT:  Head: Normocephalic and atraumatic.  Eyes: Pupils are equal, round, and reactive to light.  Neck: Normal range of motion.  Cardiovascular: Normal rate and intact distal pulses.   Pulmonary/Chest: No respiratory distress.  Abdominal: Normal appearance. He exhibits no distension.  Musculoskeletal: Normal range of motion.  Neurological: He is alert and oriented to person, place, and time. No cranial nerve deficit.  Skin: Skin is warm and dry. No rash noted.    ED Course  Procedures   DIAGNOSTIC STUDIES: Oxygen Saturation is 100% on RA, normal by my interpretation.    COORDINATION OF CARE: 9:22 PM Discussed treatment plan with pt at bedside and pt agreed to plan.   Labs Review Labs Reviewed  BASIC METABOLIC PANEL - Abnormal;  Notable for the following:    Glucose, Bld 108 (*)    All other components within normal limits    Imaging Review No results found.    MDM   Final diagnoses:  Essential hypertension    I personally performed the services described in this documentation, which was scribed in my presence. The recorded information has been reviewed and considered.   Wt Readings from Last 3 Encounters:  05/02/14 200 lb (90.719 kg)  12/26/13 198 lb (89.812 kg)  03/26/13 200 lb (90.719 kg)   Temp Readings from Last 3 Encounters:  05/02/14 98 F  (36.7 C) Oral  12/26/13 98.4 F (36.9 C) Oral  12/26/13 98.3 F (36.8 C) Oral   BP Readings from Last 3 Encounters:  05/02/14 140/90  12/27/13 131/93  12/26/13 145/96   Pulse Readings from Last 3 Encounters:  05/02/14 77  12/27/13 62  12/26/13 86    Eric Blanchard Michaelia Beilfuss, MD 05/02/14 2219

## 2014-05-18 ENCOUNTER — Emergency Department (HOSPITAL_BASED_OUTPATIENT_CLINIC_OR_DEPARTMENT_OTHER)
Admission: EM | Admit: 2014-05-18 | Discharge: 2014-05-18 | Disposition: A | Discharge status: Home / Self Care | Payer: 59 | Attending: Emergency Medicine | Admitting: Emergency Medicine

## 2014-05-18 ENCOUNTER — Emergency Department (HOSPITAL_BASED_OUTPATIENT_CLINIC_OR_DEPARTMENT_OTHER): Payer: 59

## 2014-05-18 ENCOUNTER — Encounter (HOSPITAL_BASED_OUTPATIENT_CLINIC_OR_DEPARTMENT_OTHER): Payer: Self-pay | Admitting: Emergency Medicine

## 2014-05-18 DIAGNOSIS — R52 Pain, unspecified: Secondary | ICD-10-CM

## 2014-05-18 DIAGNOSIS — Y9289 Other specified places as the place of occurrence of the external cause: Secondary | ICD-10-CM | POA: Insufficient documentation

## 2014-05-18 DIAGNOSIS — X58XXXA Exposure to other specified factors, initial encounter: Secondary | ICD-10-CM | POA: Insufficient documentation

## 2014-05-18 DIAGNOSIS — Z720 Tobacco use: Secondary | ICD-10-CM | POA: Insufficient documentation

## 2014-05-18 DIAGNOSIS — S39012A Strain of muscle, fascia and tendon of lower back, initial encounter: Secondary | ICD-10-CM

## 2014-05-18 DIAGNOSIS — S161XXA Strain of muscle, fascia and tendon at neck level, initial encounter: Secondary | ICD-10-CM | POA: Insufficient documentation

## 2014-05-18 DIAGNOSIS — I10 Essential (primary) hypertension: Secondary | ICD-10-CM | POA: Insufficient documentation

## 2014-05-18 DIAGNOSIS — Y9389 Activity, other specified: Secondary | ICD-10-CM | POA: Insufficient documentation

## 2014-05-18 DIAGNOSIS — Z79899 Other long term (current) drug therapy: Secondary | ICD-10-CM | POA: Insufficient documentation

## 2014-05-18 MED ORDER — IBUPROFEN 200 MG PO TABS
600.0000 mg | ORAL_TABLET | Freq: Once | ORAL | Status: AC
  Administered 2014-05-18: 600 mg via ORAL
  Filled 2014-05-18 (×2): qty 1

## 2014-05-18 MED ORDER — METHOCARBAMOL 500 MG PO TABS: 1000.0000 mg | ORAL_TABLET | Freq: Three times a day (TID) | ORAL | Status: DC | PRN

## 2014-05-18 MED ORDER — TRAMADOL HCL 50 MG PO TABS: 50.0000 mg | ORAL_TABLET | Freq: Four times a day (QID) | ORAL | Status: DC | PRN

## 2014-05-18 NOTE — ED Notes (Signed)
Pt reports back pain that started 3 days ago.  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  Doesn't recall a specific injury.  Reports pain in lower back.  Reports legs feeling weak.  Denies bowel or bladder loss.

## 2014-05-18 NOTE — ED Provider Notes (Signed)
CSN: [id number]     Arrival date & time 05/18/14  0039 History   First MD Initiated Contact with Patient 05/18/14 0129     Chief Complaint  Patient presents with  . Back Pain     (Consider location/radiation/quality/duration/timing/severity/associated sxs/prior Treatment) Patient is a 33 y.o. male presenting with back pain. The history is provided by the patient.  Back Pain Associated symptoms: no abdominal pain, no chest pain, no dysuria, no fever, no headaches, no numbness and no weakness   pt c/o left low back pain for the past 3 days. Constant. Dull. Worse w bending and certain position changes. No radicular pain or leg pain. No perineal or leg numbness. No weakness. No hematuria or dysuria. No hx ddd or prior back surgery. 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 No fever or chills. No leg numbness/weakness. No anterior/abd pain.     Past Medical History  Diagnosis Date  . Hypertension    History reviewed. No pertinent past surgical history. No family history on file. History  Substance Use Topics  . Smoking status: Current Every Day Smoker -- 0.50 packs/day    Types: Cigarettes  . Smokeless tobacco: Never Used  . Alcohol Use: Yes     Comment: socially    Review of Systems  Constitutional: Negative for fever and chills.  HENT: Negative for sore throat.   Eyes: Negative for redness.  Respiratory: Negative for shortness of breath.   Cardiovascular: Negative for chest pain and leg swelling.  Gastrointestinal: Negative for nausea, vomiting and abdominal pain.  Genitourinary: Negative for dysuria, hematuria and flank pain.  Musculoskeletal: Positive for back pain. Negative for neck pain.  Skin: Negative for rash.  Neurological: Negative for weakness, numbness and headaches.  Hematological: Does not bruise/bleed easily.  Psychiatric/Behavioral: Negative for confusion.      Allergies  Review of patient's allergies indicates no known  allergies.  Home Medications   Prior to Admission medications   Medication Sig Start Date End Date Taking? Authorizing Provider  HYDROcodone-acetaminophen (NORCO/VICODIN) 5-325 MG per tablet Take 1 tablet by mouth every 6 (six) hours as needed for moderate pain. 12/27/13   Carlyle Dollyhristopher W Lawyer, PA-C  HYDROcodone-acetaminophen (NORCO/VICODIN) 5-325 MG per tablet Take 2 tablets by mouth every 4 (four) hours as needed for moderate pain or severe pain. 05/02/14   Nelia Shiobert L Beaton, MD  ibuprofen (ADVIL,MOTRIN) 800 MG tablet Take 1 tablet (800 mg total) by mouth every 8 (eight) hours as needed. 12/27/13   Jamesetta Orleanshristopher W Lawyer, PA-C  lisinopril (PRINIVIL,ZESTRIL) 20 MG tablet Take 1 tablet (20 mg total) by mouth daily. 05/02/14   Nelia Shiobert L Beaton, MD   BP 153/92  Pulse 72  Temp(Src) 97.9 F (36.6 C) (Oral)  Resp 20  Ht 5\' 11"  (1.803 m)  Wt 200 lb (90.719 kg)  BMI 27.91 kg/m2  SpO2 100% Physical Exam  Nursing note and vitals reviewed. Constitutional: He is oriented to person, place, and time. He appears well-developed and well-nourished. No distress.  HENT:  Head: Atraumatic.  Mouth/Throat: Oropharynx is clear and moist.  Eyes: Pupils are equal, round, and reactive to light.  Neck: Neck supple. No tracheal deviation present.  Cardiovascular: Normal rate and intact distal pulses.   Pulmonary/Chest: Effort normal and breath sounds normal. No accessory muscle usage. No respiratory distress.  Abdominal: Soft. Bowel sounds are normal. He exhibits no distension and no mass. There is no tenderness. There is no rebound and no guarding.  Genitourinary:  No cva tenderness  Musculoskeletal:  Normal range of motion. He exhibits no edema and no tenderness.  Left lumbar muscular tenderness reproducing symptoms. CTLS spine, non tender, aligned, no step off.   Neurological: He is alert and oriented to person, place, and time. He displays normal reflexes.  Motor intact bil, stren 5/5. sens intact. Steady gait.      Skin: Skin is warm and dry. He is not diaphoretic.  Psychiatric: He has a normal mood and affect.    ED Course  Procedures (including critical care time)  Imaging Review Dg Lumbar Spine Complete  05/18/2014   CLINICAL DATA:  Lower back pain  EXAM: LUMBAR SPINE - COMPLETE 4+ VIEW  COMPARISON:  07/19/2006 abdominal radiographs  FINDINGS: Gentle leftward curvature may be accentuated by positioning. Otherwise, maintained vertebral body height and alignment. No displaced fracture. No aggressive osseous lesion.  IMPRESSION: No acute or aggressive osseous finding of the lumbar spine.   Electronically Signed   By: Jearld LeschAndrew  DelGaizo M.D.   On: 05/18/2014 01:13    MDM   Motrin po.  Reviewed nursing notes and prior charts for additional history.   Spine nt.   Pt appears stable for d/c.    Suzi RootsKevin E Chantae Soo, MD 05/18/14 [phone]

## 2014-05-18 NOTE — Discharge Instructions (Signed)
It was our pleasure to provide your ER care today - we hope that you feel better.  Rest. Avoid heavy lifting > 20 lbs, or bending at waist, for the next 3 days.  Heat/heating pad to sore area. Take motrin as need for pain. You may also take ultram as need for pain - no driving when taking. You may take robaxin as need for muscle spasm - no driving when taking. Follow up with primary care doctor in 1 week - also have blood pressure rechecked as it is mildly high tonight.  Return to ER if worse, new symptoms, leg numbness/weakness, fevers, other concern.    Lumbosacral Strain Lumbosacral strain is a strain of any of the parts that make up your lumbosacral vertebrae. Your lumbosacral vertebrae are the bones that make up the lower third of your backbone. Your lumbosacral vertebrae are held together by muscles and tough, fibrous tissue (ligaments).  CAUSES  A sudden blow to your back can cause lumbosacral strain. Also, anything that causes an excessive stretch of the muscles in the low back can cause this strain. This is typically seen when people exert themselves strenuously, fall, lift heavy objects, bend, or crouch repeatedly. RISK FACTORS  Physically demanding work.  Participation in pushing or pulling sports or sports that require a sudden twist of the back (tennis, golf, baseball).  Weight lifting.  Excessive lower back curvature.  Forward-tilted pelvis.  Weak back or abdominal muscles or both.  Tight hamstrings. SIGNS AND SYMPTOMS  Lumbosacral strain may cause pain in the area of your injury or pain that moves (radiates) down your leg.  DIAGNOSIS Your health care provider can often diagnose lumbosacral strain through a physical exam. In some cases, you may need tests such as X-ray exams.  TREATMENT  Treatment for your lower back injury depends on many factors that your clinician will have to evaluate. However, most treatment will include the use of anti-inflammatory  medicines. HOME CARE INSTRUCTIONS   Avoid hard physical activities (tennis, racquetball, waterskiing) if you are not in proper physical condition for it. This may aggravate or create problems.  If you have a back problem, avoid sports requiring sudden body movements. Swimming and walking are generally safer activities.  Maintain good posture.  Maintain a healthy weight.  For acute conditions, you may put ice on the injured area.  Put ice in a plastic bag.  Place a towel between your skin and the bag.  Leave the ice on for 20 minutes, 2-3 times a day.  When the low back starts healing, stretching and strengthening exercises may be recommended. SEEK MEDICAL CARE IF:  Your back pain is getting worse.  You experience severe back pain not relieved with medicines. SEEK IMMEDIATE MEDICAL CARE IF:   You have numbness, tingling, weakness, or problems with the use of your arms or legs.  There is a change in bowel or bladder control.  You have increasing pain in any area of the body, including your belly (abdomen).  You notice shortness of breath, dizziness, or feel faint.  You feel sick to your stomach (nauseous), are throwing up (vomiting), or become sweaty.  You notice discoloration of your toes or legs, or your feet get very cold. MAKE SURE YOU:   Understand these instructions.  Will watch your condition.  Will get help right away if you are not doing well or get worse. Document Released: 04/18/2005 Document Revised: 07/14/2013 Document Reviewed: 02/25/2013 [facility] Patient Information 2015 [place], [place]. This information is not  intended to replace advice given to you by your health care provider. Make sure you discuss any questions you have with your health care provider.    Back Pain, Adult Low back pain is very common. About 1 in 5 people have back pain.The cause of low back pain is rarely dangerous. The pain often gets better over time.About half of people with a  sudden onset of back pain feel better in just 2 weeks. About 8 in 10 people feel better by 6 weeks.  CAUSES Some common causes of back pain include:  Strain of the muscles or ligaments supporting the spine.  Wear and tear (degeneration) of the spinal discs.  Arthritis.  Direct injury to the back. DIAGNOSIS Most of the time, the direct cause of low back pain is not known.However, back pain can be treated effectively even when the exact cause of the pain is unknown.Answering your caregiver's questions about your overall health and symptoms is one of the most accurate ways to make sure the cause of your pain is not dangerous. If your caregiver needs more information, he or she may order lab work or imaging tests (X-rays or MRIs).However, even if imaging tests show changes in your back, this usually does not require surgery. HOME CARE INSTRUCTIONS For many people, back pain returns.Since low back pain is rarely dangerous, it is often a condition that people can learn to [facility] their own.   Remain active. It is stressful on the back to sit or stand in one place. Do not sit, drive, or stand in one place for more than 30 minutes at a time. Take short walks on level surfaces as soon as pain allows.Try to increase the length of time you walk each day.  Do not stay in bed.Resting more than 1 or 2 days can delay your recovery.  Do not avoid exercise or work.Your body is made to move.It is not dangerous to be active, even though your back may hurt.Your back will likely heal faster if you return to being active before your pain is gone.  Pay attention to your body when you bend and lift. Many people have less discomfortwhen lifting if they bend their knees, keep the load close to their bodies,and avoid twisting. Often, the most comfortable positions are those that put less stress on your recovering back.  Find a comfortable position to sleep. Use a firm mattress and lie on your side with  your knees slightly bent. If you lie on your back, put a pillow under your knees.  Only take over-the-counter or prescription medicines as directed by your caregiver. Over-the-counter medicines to reduce pain and inflammation are often the most helpful.Your caregiver may prescribe muscle relaxant drugs.These medicines help dull your pain so you can more quickly return to your normal activities and healthy exercise.  Put ice on the injured area.  Put ice in a plastic bag.  Place a towel between your skin and the bag.  Leave the ice on for 15-20 minutes, 03-04 times a day for the first 2 to 3 days. After that, ice and heat may be alternated to reduce pain and spasms.  Ask your caregiver about trying back exercises and gentle massage. This may be of some benefit.  Avoid feeling anxious or stressed.Stress increases muscle tension and can worsen back pain.It is important to recognize when you are anxious or stressed and learn ways to manage it.Exercise is a great option. SEEK MEDICAL CARE IF:  You have pain that is not  relieved with rest or medicine.  You have pain that does not improve in 1 week.  You have new symptoms.  You are generally not feeling well. SEEK IMMEDIATE MEDICAL CARE IF:   You have pain that radiates from your back into your legs.  You develop new bowel or bladder control problems.  You have unusual weakness or numbness in your arms or legs.  You develop nausea or vomiting.  You develop abdominal pain.  You feel faint. Document Released: 07/09/2005 Document Revised: 01/08/2012 Document Reviewed: 11/10/2013 [facility]ExitCare Patient Information 2015 [place]ExitCare, [place]LL[place]. This information is not intended to replace advice given to you by your health care provider. Make sure you discuss any questions you have with your health care provider.    Heat Therapy Heat therapy can help ease sore, stiff, injured, and tight muscles and joints. Heat relaxes your muscles, which may  help ease your pain.  RISKS AND COMPLICATIONS If you have any of the following conditions, do not use heat therapy unless your health care provider has approved:  Poor circulation.  Healing wounds or scarred skin in the area being treated.  Diabetes, heart disease, or high blood pressure.  Not being able to feel (numbness) the area being treated.  Unusual swelling of the area being treated.  Active infections.  Blood clots.  Cancer.  Inability to communicate pain. This may include young children and people who have problems with their brain function (dementia).  Pregnancy. Heat therapy should only be used on old, pre-existing, or long-lasting (chronic) injuries. Do not use heat therapy on new injuries unless directed by your health care provider. HOW TO USE HEAT THERAPY There are several different kinds of heat therapy, including:  Moist heat pack.  Warm water bath.  Hot water bottle.  Electric heating pad.  Heated gel pack.  Heated wrap.  Electric heating pad. Use the heat therapy method suggested by your health care provider. Follow your health care provider's instructions on when and how to use heat therapy. GENERAL HEAT THERAPY RECOMMENDATIONS  Do not sleep while using heat therapy. Only use heat therapy while you are awake.  Your skin may turn pink while using heat therapy. Do not use heat therapy if your skin turns red.  Do not use heat therapy if you have new pain.  High heat or long exposure to heat can cause burns. Be careful when using heat therapy to avoid burning your skin.  Do not use heat therapy on areas of your skin that are already irritated, such as with a rash or sunburn. SEEK MEDICAL CARE IF:  You have blisters, redness, swelling, or numbness.  You have new pain.  Your pain is worse. MAKE SURE YOU:  Understand these instructions.  Will watch your condition.  Will get help right away if you are not doing well or get worse. Document  Released: 10/01/2011 Document Revised: 11/23/2013 Document Reviewed: 09/01/2013 [facility] CenterExitCare Patient Information 2015 [place]Exit[place], [place]. This information is not intended to replace advice given to you by your health care provider. Make sure you discuss any questions you have with your health care provider.

## 2014-05-19 ENCOUNTER — Emergency Department (HOSPITAL_BASED_OUTPATIENT_CLINIC_OR_DEPARTMENT_OTHER)
Admission: EM | Admit: 2014-05-19 | Discharge: 2014-05-19 | Disposition: A | Discharge status: Home / Self Care | Payer: 59 | Attending: Emergency Medicine | Admitting: Emergency Medicine

## 2014-05-19 ENCOUNTER — Encounter (HOSPITAL_BASED_OUTPATIENT_CLINIC_OR_DEPARTMENT_OTHER): Payer: Self-pay | Admitting: Emergency Medicine

## 2014-05-19 DIAGNOSIS — Z720 Tobacco use: Secondary | ICD-10-CM | POA: Insufficient documentation

## 2014-05-19 DIAGNOSIS — Z79899 Other long term (current) drug therapy: Secondary | ICD-10-CM | POA: Insufficient documentation

## 2014-05-19 DIAGNOSIS — I10 Essential (primary) hypertension: Secondary | ICD-10-CM | POA: Insufficient documentation

## 2014-05-19 DIAGNOSIS — X58XXXD Exposure to other specified factors, subsequent encounter: Secondary | ICD-10-CM | POA: Insufficient documentation

## 2014-05-19 DIAGNOSIS — S39012D Strain of muscle, fascia and tendon of lower back, subsequent encounter: Secondary | ICD-10-CM

## 2014-05-19 MED ORDER — MELOXICAM 7.5 MG PO TABS: 7.5000 mg | ORAL_TABLET | Freq: Every day | ORAL | Status: DC

## 2014-05-19 MED ORDER — PREDNISONE 50 MG PO TABS
60.0000 mg | ORAL_TABLET | Freq: Once | ORAL | Status: AC
  Administered 2014-05-19: 60 mg via ORAL
  Filled 2014-05-19 (×2): qty 1

## 2014-05-19 NOTE — ED Provider Notes (Signed)
CSN: [id number]     Arrival date & time 05/19/14  2151 History  This chart was scribed for Tanajah Boulter Smitty CordsK Azael Ragain-Rasch, MD by Gwenyth Oberatherine Macek, ED Scribe. This patient was seen in room MH12/MH12 and the patient's care was started at 11:15 PM.     Chief Complaint  Patient presents with  . Back Pain   Patient is a 33 y.o. male presenting with back pain. The history is provided by the patient. No language interpreter was used.  Back Pain Location:  Sacro-iliac joint Quality:  Aching Pain severity:  Severe Pain is:  Same all the time Onset quality:  Sudden Timing:  Constant Progression:  Unchanged Chronicity:  New Context: lifting heavy objects   Relieved by:  Nothing Worsened by:  Bending and twisting Ineffective treatments: ultram and robaxin. Associated symptoms: no abdominal pain, no abdominal swelling, no bladder incontinence, no bowel incontinence, no chest pain, no dysuria, no fever, no headaches, no numbness, no paresthesias, no pelvic pain, no perianal numbness, no tingling, no weakness and no weight loss   Risk factors: no hx of cancer    HPI Comments: Eric Blanchard is a 33 y.o. male who presents to the Emergency Department complaining of chronic, constant back muscle spasms and soreness. Pt was in the ED yesterday for similar symptoms. 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 He has tried heating pad and stretching with no relief to symptoms. Pt is currently taking Ultram and Robaxin. He denies hematuria, dysuria, and changes with bowel movements as associated symptoms.    Past Medical History  Diagnosis Date  . Hypertension    History reviewed. No pertinent past surgical history. No family history on file. History  Substance Use Topics  . Smoking status: Current Every Day Smoker -- 0.50 packs/day    Types: Cigarettes  . Smokeless tobacco: Never Used  . Alcohol Use: Yes     Comment: socially     Review of Systems  Constitutional: Negative for fever and weight loss.  Cardiovascular: Negative for chest pain.  Gastrointestinal: Negative for abdominal pain, blood in stool and bowel incontinence.  Genitourinary: Negative for bladder incontinence, dysuria, hematuria, difficulty urinating and pelvic pain.  Musculoskeletal: Positive for back pain.  Neurological: Negative for tingling, weakness, numbness, headaches and paresthesias.  All other systems reviewed and are negative.     Allergies  Review of patient's allergies indicates no known allergies.  Home Medications   Prior to Admission medications   Medication Sig Start Date End Date Taking? Authorizing Provider  HYDROcodone-acetaminophen (NORCO/VICODIN) 5-325 MG per tablet Take 1 tablet by mouth every 6 (six) hours as needed for moderate pain. 12/27/13   Carlyle Dollyhristopher W Lawyer, PA-C  HYDROcodone-acetaminophen (NORCO/VICODIN) 5-325 MG per tablet Take 2 tablets by mouth every 4 (four) hours as needed for moderate pain or severe pain. 05/02/14   Nelia Shiobert L Beaton, MD  ibuprofen (ADVIL,MOTRIN) 800 MG tablet Take 1 tablet (800 mg total) by mouth every 8 (eight) hours as needed. 12/27/13   Jamesetta Orleanshristopher W Lawyer, PA-C  lisinopril (PRINIVIL,ZESTRIL) 20 MG tablet Take 1 tablet (20 mg total) by mouth daily. 05/02/14   Nelia Shiobert L Beaton, MD  methocarbamol (ROBAXIN) 500 MG tablet Take 2 tablets (1,000 mg total) by mouth 3 (three) times daily as needed for muscle spasms. 05/18/14   Suzi RootsKevin E Steinl, MD  traMADol (ULTRAM) 50 MG tablet Take 1 tablet (50 mg total) by mouth every  6 (six) hours as needed. 05/18/14   Suzi RootsKevin E Steinl, MD   BP 140/95  Pulse 78  Temp(Src) 98.6 F (37 C) (Oral)  Resp 18  SpO2 100% Physical Exam  Nursing note and vitals reviewed. Constitutional: He is oriented to person, place, and time. He appears well-developed and well-nourished. No distress.  HENT:  Head: Normocephalic and atraumatic.  Mouth/Throat: Oropharynx is clear  and moist. No oropharyngeal exudate.  Eyes: EOM are normal. Pupils are equal, round, and reactive to light.  Neck: Normal range of motion. Neck supple.  Cardiovascular: Normal rate, regular rhythm and normal heart sounds.   Pulmonary/Chest: Effort normal and breath sounds normal. He has no wheezes. He has no rales.  Abdominal: Soft. Bowel sounds are normal. There is no tenderness. There is no rebound and no guarding.  Musculoskeletal: Normal range of motion. He exhibits no edema and no tenderness.  No step-offs, no crepitus, no point tenderness midline of the c t or l spine.  Neurological: He is alert and oriented to person, place, and time. He has normal reflexes. No cranial nerve deficit. He exhibits normal muscle tone.  Skin: Skin is warm and dry. No rash noted.  Psychiatric: He has a normal mood and affect. His behavior is normal.    ED Course  Procedures (including critical care time) DIAGNOSTIC STUDIES: Oxygen Saturation is 100% on RA, normal by my interpretation.    COORDINATION OF CARE: 11:20 PM Discussed treatment plan with pt at bedside and pt agreed to plan.  Labs Review Labs Reviewed - No data to display  Imaging Review Dg Lumbar Spine Complete  05/18/2014   CLINICAL DATA:  Lower back pain  EXAM: LUMBAR SPINE - COMPLETE 4+ VIEW  COMPARISON:  07/19/2006 abdominal radiographs  FINDINGS: Gentle leftward curvature may be accentuated by positioning. Otherwise, maintained vertebral body height and alignment. No displaced fracture. No aggressive osseous lesion.  IMPRESSION: No acute or aggressive osseous finding of the lumbar spine.   Electronically Signed   By: Jearld LeschAndrew  DelGaizo M.D.   On: 05/18/2014 01:13     EKG Interpretation None      MDM   Final diagnoses:  None    Will add mobic to current regimen and give 2 more days off.  Follow up with your family doctor for ongoing care  I personally performed the services described in this documentation, which was scribed in  my presence. The recorded information has been reviewed and is accurate.      Jasmine AweApril K Nimrod Wendt-Rasch, MD 05/20/14 [phone]

## 2014-05-19 NOTE — ED Notes (Signed)
Pt reports back pain and has been seen here two days ago for same.  Reports they want him back at work and needs to be seen again because the pills are not working.

## 2014-05-19 NOTE — Discharge Instructions (Signed)

## 2014-05-20 ENCOUNTER — Encounter (HOSPITAL_BASED_OUTPATIENT_CLINIC_OR_DEPARTMENT_OTHER): Payer: Self-pay | Admitting: Emergency Medicine

## 2014-06-21 ENCOUNTER — Emergency Department (HOSPITAL_BASED_OUTPATIENT_CLINIC_OR_DEPARTMENT_OTHER): Payer: No Typology Code available for payment source

## 2014-06-21 ENCOUNTER — Encounter (HOSPITAL_BASED_OUTPATIENT_CLINIC_OR_DEPARTMENT_OTHER): Payer: Self-pay | Admitting: *Deleted

## 2014-06-21 ENCOUNTER — Emergency Department (HOSPITAL_BASED_OUTPATIENT_CLINIC_OR_DEPARTMENT_OTHER)
Admission: EM | Admit: 2014-06-21 | Discharge: 2014-06-21 | Disposition: A | Discharge status: Home / Self Care | Payer: Self-pay | Attending: Emergency Medicine | Admitting: Emergency Medicine

## 2014-06-21 DIAGNOSIS — W2203XA Walked into furniture, initial encounter: Secondary | ICD-10-CM | POA: Insufficient documentation

## 2014-06-21 DIAGNOSIS — S62639B Displaced fracture of distal phalanx of unspecified finger, initial encounter for open fracture: Secondary | ICD-10-CM

## 2014-06-21 DIAGNOSIS — T1490XA Injury, unspecified, initial encounter: Secondary | ICD-10-CM

## 2014-06-21 DIAGNOSIS — Z720 Tobacco use: Secondary | ICD-10-CM | POA: Insufficient documentation

## 2014-06-21 DIAGNOSIS — S62630A Displaced fracture of distal phalanx of right index finger, initial encounter for closed fracture: Secondary | ICD-10-CM | POA: Insufficient documentation

## 2014-06-21 DIAGNOSIS — Z791 Long term (current) use of non-steroidal anti-inflammatories (NSAID): Secondary | ICD-10-CM | POA: Insufficient documentation

## 2014-06-21 DIAGNOSIS — Y9389 Activity, other specified: Secondary | ICD-10-CM | POA: Insufficient documentation

## 2014-06-21 DIAGNOSIS — Y998 Other external cause status: Secondary | ICD-10-CM | POA: Insufficient documentation

## 2014-06-21 DIAGNOSIS — Z79899 Other long term (current) drug therapy: Secondary | ICD-10-CM | POA: Insufficient documentation

## 2014-06-21 DIAGNOSIS — I10 Essential (primary) hypertension: Secondary | ICD-10-CM | POA: Insufficient documentation

## 2014-06-21 DIAGNOSIS — Y929 Unspecified place or not applicable: Secondary | ICD-10-CM | POA: Insufficient documentation

## 2014-06-21 MED ORDER — HYDROCODONE-ACETAMINOPHEN 5-325 MG PO TABS: 2.0000 | ORAL_TABLET | ORAL | Status: DC | PRN

## 2014-06-21 MED ORDER — SULFAMETHOXAZOLE-TRIMETHOPRIM 800-160 MG PO TABS: 1.0000 | ORAL_TABLET | Freq: Two times a day (BID) | ORAL | Status: AC

## 2014-06-21 NOTE — ED Notes (Signed)
Reports he slammed right pinky finger in car door approx 1 hour pta

## 2014-06-21 NOTE — ED Provider Notes (Signed)
CSN: [id number]     Arrival date & time 06/21/14  1608 History   First MD Initiated Contact with Patient 06/21/14 1716     Chief Complaint  Patient presents with  . Finger Injury     (Consider location/radiation/quality/duration/timing/severity/associated sxs/prior Treatment) Patient is a 33 y.o. male presenting with hand pain. The history is provided by the patient. No language interpreter was used.  Hand Pain This is a new problem. The current episode started today. The problem occurs constantly. The problem has been unchanged. He has tried nothing for the symptoms. The treatment provided moderate relief.    Past Medical History  Diagnosis Date  . Hypertension    No past surgical history on file. No family history on file. History  Substance Use Topics  . Smoking status: Current Every Day Smoker -- 0.50 packs/day    Types: Cigarettes  . Smokeless tobacco: Never Used  . Alcohol Use: Yes     Comment: socially    Review of Systems  Skin: Positive for wound.  All other systems reviewed and are negative.     Allergies  Review of patient's allergies indicates no known allergies.  Home Medications   Prior to Admission medications   Medication Sig Start Date End Date Taking? Authorizing Provider  lisinopril (PRINIVIL,ZESTRIL) 20 MG tablet Take 1 tablet (20 mg total) by mouth daily. 05/02/14  Yes Nelia Shiobert L Beaton, MD  HYDROcodone-acetaminophen (NORCO/VICODIN) 5-325 MG per tablet Take 1 tablet by mouth every 6 (six) hours as needed for moderate pain. 12/27/13   Carlyle Dollyhristopher W Lawyer, PA-C  HYDROcodone-acetaminophen (NORCO/VICODIN) 5-325 MG per tablet Take 2 tablets by mouth every 4 (four) hours as needed for moderate pain or severe pain. 05/02/14   Nelia Shiobert L Beaton, MD  ibuprofen (ADVIL,MOTRIN) 800 MG tablet Take 1 tablet (800 mg total) by mouth every 8 (eight) hours as needed. 12/27/13   Jamesetta Orleanshristopher W Lawyer, PA-C  meloxicam (MOBIC) 7.5 MG tablet Take 1 tablet (7.5 mg total) by  mouth daily. 05/19/14   April K Palumbo-Rasch, MD  methocarbamol (ROBAXIN) 500 MG tablet Take 2 tablets (1,000 mg total) by mouth 3 (three) times daily as needed for muscle spasms. 05/18/14   Suzi RootsKevin E Steinl, MD  traMADol (ULTRAM) 50 MG tablet Take 1 tablet (50 mg total) by mouth every 6 (six) hours as needed. 05/18/14   Suzi RootsKevin E Steinl, MD   BP 152/98 mmHg  Pulse 66  Temp(Src) 98.1 F (36.7 C) (Oral)  Resp 18  Ht 5\' 11"  (1.803 m)  Wt 200 lb (90.719 kg)  BMI 27.91 kg/m2  SpO2 100% Physical Exam  Constitutional: He appears well-developed and well-nourished.  Musculoskeletal: He exhibits tenderness.  Swollen tender right 5th finger.  nv and ns intact  Neurological: He is alert.  Psychiatric: He has a normal mood and affect.    ED Course  Procedures (including critical care time) Labs Review Labs Reviewed - No data to display  Imaging Review Dg Finger Little Right  06/21/2014   CLINICAL DATA:  Patient slammed his right little finger into the door, pain  EXAM: RIGHT LITTLE FINGER 2+V  COMPARISON:  None.  FINDINGS: There is a comminuted fracture of the tuft of the right fifth digit.  There is no other fracture or dislocation. There is no soft tissue abnormality.  IMPRESSION: Comminuted traumatic fracture of the tuft of the right fifth digit.   Electronically Signed   By: Elige KoHetal  Patel   On: 06/21/2014 17:00     EKG Interpretation  None     Pt soaked in betadine,   Pt counseled on injury.   Bandage, splint,   Bactrim, Hydrocodone Pt advised to follow up with Dr. Janee Mornhompson for recheck this week MDM   Final diagnoses:  Fracture, finger, distal phalanx, open, initial encounter        Elson AreasLeslie K Sofia, PA-C 06/21/14 1757  Mirian MoMatthew Gentry, MD 06/27/14 [phone]

## 2014-06-21 NOTE — Discharge Instructions (Signed)

## 2014-06-21 NOTE — ED Notes (Signed)
PA at bedside.

## 2016-07-21 ENCOUNTER — Emergency Department (HOSPITAL_COMMUNITY)
Admission: EM | Admit: 2016-07-21 | Discharge: 2016-07-21 | Disposition: A | Discharge status: Home / Self Care | Payer: Self-pay | Attending: Dermatology | Admitting: Dermatology

## 2016-07-21 DIAGNOSIS — Z5321 Procedure and treatment not carried out due to patient leaving prior to being seen by health care provider: Secondary | ICD-10-CM | POA: Insufficient documentation

## 2016-07-21 DIAGNOSIS — F17210 Nicotine dependence, cigarettes, uncomplicated: Secondary | ICD-10-CM | POA: Insufficient documentation

## 2016-07-21 DIAGNOSIS — I10 Essential (primary) hypertension: Secondary | ICD-10-CM | POA: Insufficient documentation

## 2016-07-21 DIAGNOSIS — R1110 Vomiting, unspecified: Secondary | ICD-10-CM | POA: Insufficient documentation

## 2016-07-21 DIAGNOSIS — Z79899 Other long term (current) drug therapy: Secondary | ICD-10-CM | POA: Insufficient documentation

## 2016-07-21 NOTE — ED Notes (Signed)
After taking the Pt vitals he stated that he did not want to wait to be seen.

## 2016-07-21 NOTE — ED Notes (Signed)
This RN notified patient did not want to wait and left.

## 2016-08-05 ENCOUNTER — Emergency Department (HOSPITAL_COMMUNITY)
Admission: EM | Admit: 2016-08-05 | Discharge: 2016-08-06 | Disposition: A | Discharge status: Home / Self Care | Payer: Self-pay | Attending: Emergency Medicine | Admitting: Emergency Medicine

## 2016-08-05 DIAGNOSIS — M5432 Sciatica, left side: Secondary | ICD-10-CM | POA: Insufficient documentation

## 2016-08-05 DIAGNOSIS — I10 Essential (primary) hypertension: Secondary | ICD-10-CM | POA: Insufficient documentation

## 2016-08-05 DIAGNOSIS — F17210 Nicotine dependence, cigarettes, uncomplicated: Secondary | ICD-10-CM | POA: Insufficient documentation

## 2016-08-05 DIAGNOSIS — G8929 Other chronic pain: Secondary | ICD-10-CM | POA: Insufficient documentation

## 2016-08-05 DIAGNOSIS — Z79899 Other long term (current) drug therapy: Secondary | ICD-10-CM | POA: Insufficient documentation

## 2016-08-05 NOTE — ED Triage Notes (Signed)
Pt states he has had lower back pain x 1 month that has progressively gotten worse. Pain now runs down his legs. Alert and oriented.

## 2016-08-06 MED ORDER — DEXAMETHASONE SODIUM PHOSPHATE 10 MG/ML IJ SOLN
10.0000 mg | Freq: Once | INTRAMUSCULAR | Status: AC
  Administered 2016-08-06: 10 mg via INTRAMUSCULAR
  Filled 2016-08-06: qty 1

## 2016-08-06 MED ORDER — PREDNISONE 20 MG PO TABS: ORAL_TABLET | ORAL | 0 refills | Status: DC

## 2016-08-06 MED ORDER — BACLOFEN 10 MG PO TABS: 5.0000 mg | ORAL_TABLET | Freq: Three times a day (TID) | ORAL | 0 refills | Status: DC | PRN

## 2016-08-06 MED ORDER — KETOROLAC TROMETHAMINE 60 MG/2ML IM SOLN
60.0000 mg | Freq: Once | INTRAMUSCULAR | Status: AC
  Administered 2016-08-06: 60 mg via INTRAMUSCULAR
  Filled 2016-08-06: qty 2

## 2016-08-06 MED ORDER — MELOXICAM 15 MG PO TABS: 15.0000 mg | ORAL_TABLET | Freq: Every day | ORAL | 0 refills | Status: DC

## 2016-08-06 NOTE — Discharge Instructions (Signed)
SEEK IMMEDIATE MEDICAL ATTENTION IF: New numbness, tingling, weakness, or problem with the use of your arms or legs.  Severe back pain not relieved with medications.  Change in bowel or bladder control.  Increasing pain in any areas of the body (such as chest or abdominal pain).  Shortness of breath, dizziness or fainting.  Nausea (feeling sick to your stomach), vomiting, fever, or sweats.  

## 2016-08-06 NOTE — ED Provider Notes (Signed)
[facility]-EMERGENCY DEPT Provider Note   CSN: [id number] Arrival date & time: 08/05/16  2249     History   Chief Complaint Chief Complaint  Patient presents with  . Back Pain    HPI Eric Blanchard is a 36 y.o. male with a past medical history of chronic self-limited episodes of low back pain. Patient states that he normally has some tenderness. Occasionally has had some pain that radiates down the left leg. However, his resolved within a few days. The patient has had persistent lower back pain. He has only tried Advil PM, which she states works because it knocks him out. He has not tried any other medications. His chronic daily smoker. He complains of left lower back pain radiating down the back of his leg that does not pass the knee. He denies weakness, loss of bowel or bladder control. He denies IV drug use or recent procedures to his back. His pain is made worse with lying flat or standing for leaking periods of time. He picked up his son yesterday and had worsening pain. He denies any other inciting injuries.  HPI  Past Medical History:  Diagnosis Date  . Hypertension     There are no active problems to display for this patient.   No past surgical history on file.     Home Medications    Prior to Admission medications   Medication Sig Start Date End Date Taking? Authorizing Provider  baclofen (LIORESAL) 10 MG tablet Take 0.5-1 tablets (5-10 mg total) by mouth 3 (three) times daily as needed for muscle spasms. 08/06/16   Arthor Captain, PA-C  HYDROcodone-acetaminophen (NORCO/VICODIN) 5-325 MG per tablet Take 2 tablets by mouth every 4 (four) hours as needed. 06/21/14   Elson Areas, PA-C  ibuprofen (ADVIL,MOTRIN) 800 MG tablet Take 1 tablet (800 mg total) by mouth every 8 (eight) hours as needed. 12/27/13   Charlestine Night, PA-C  lisinopril (PRINIVIL,ZESTRIL) 20 MG tablet Take 1 tablet (20 mg total) by mouth daily. 05/02/14   Nelva Nay, MD  meloxicam (MOBIC) 15 MG tablet  Take 1 tablet (15 mg total) by mouth daily. Take 1 daily with food. 08/06/16   Arthor Captain, PA-C  methocarbamol (ROBAXIN) 500 MG tablet Take 2 tablets (1,000 mg total) by mouth 3 (three) times daily as needed for muscle spasms. 05/18/14   Cathren Laine, MD  predniSONE (DELTASONE) 20 MG tablet 3 tabs po daily x 3 days, then 2 tabs x 3 days, then 1.5 tabs x 3 days, then 1 tab x 3 days, then 0.5 tabs x 3 days 08/08/16   Arthor Captain, PA-C  traMADol (ULTRAM) 50 MG tablet Take 1 tablet (50 mg total) by mouth every 6 (six) hours as needed. 05/18/14   Cathren Laine, MD    Family History No family history on file.  Social History Social History  Substance Use Topics  . Smoking status: Current Every Day Smoker    Packs/day: 0.50    Types: Cigarettes  . Smokeless tobacco: Never Used  . Alcohol use Yes     Comment: socially     Allergies   Patient has no known allergies.   Review of Systems Review of Systems  Ten systems reviewed and are negative for acute change, except as noted in the HPI.   Physical Exam Updated Vital Signs BP 119/83   Pulse 74   Temp 99 F (37.2 C) (Oral)   Resp 18   SpO2 100%   Physical Exam  Constitutional: He  appears well-developed and well-nourished. No distress.  HENT:  Head: Normocephalic and atraumatic.  Eyes: Conjunctivae are normal. No scleral icterus.  Neck: Normal range of motion. Neck supple.  Cardiovascular: Normal rate, regular rhythm and normal heart sounds.   Pulmonary/Chest: Effort normal and breath sounds normal. No respiratory distress.  Abdominal: Soft. There is no tenderness.  Musculoskeletal: He exhibits no edema.  Patient appears to be in mild to moderate pain, antalgic gait noted. Lumbosacral spine area reveals no local tenderness or mass. Painful and reduced LS ROM noted. Straight leg raise is positive on left. DTR's, motor strength and sensation normal, including heel and toe gait.  Peripheral pulses are palpable.   Neurological:  He is alert.  Skin: Skin is warm and dry. He is not diaphoretic.  Psychiatric: His behavior is normal.  Nursing note and vitals reviewed.    ED Treatments / Results  Labs (all labs ordered are listed, but only abnormal results are displayed) Labs Reviewed - No data to display  EKG  EKG Interpretation None       Radiology No results found.  Procedures Procedures (including critical care time)  Medications Ordered in ED Medications  ketorolac (TORADOL) injection 60 mg (60 mg Intramuscular Given 08/06/16 0043)  dexamethasone (DECADRON) injection 10 mg (10 mg Intramuscular Given 08/06/16 0042)     Initial Impression / Assessment and Plan / ED Course  I have reviewed the triage vital signs and the nursing notes.  Pertinent labs & imaging results that were available during my care of the patient were reviewed by me and considered in my medical decision making (see chart for details).  Clinical Course     Patient with back pain.  No neurological deficits and normal neuro exam.  Patient can walk but states is painful.  No loss of bowel or bladder control.  No concern for cauda equina.  No fever, night sweats, weight loss, h/o cancer, IVDU.  RICE protocol and pain medicine indicated and discussed with patient.   Final Clinical Impressions(s) / ED Diagnoses   Final diagnoses:  Sciatica of left side    New Prescriptions Discharge Medication List as of 08/06/2016 12:37 AM    START taking these medications   Details  baclofen (LIORESAL) 10 MG tablet Take 0.5-1 tablets (5-10 mg total) by mouth 3 (three) times daily as needed for muscle spasms., Starting Mon 08/06/2016, Print    predniSONE (DELTASONE) 20 MG tablet 3 tabs po daily x 3 days, then 2 tabs x 3 days, then 1.5 tabs x 3 days, then 1 tab x 3 days, then 0.5 tabs x 3 days, Print         Arthor Captainbigail Dorothy Landgrebe, PA-C 08/06/16 0107    Mancel BaleElliott Wentz, MD 08/07/16 [id number]

## 2017-07-21 ENCOUNTER — Encounter (HOSPITAL_COMMUNITY): Payer: Self-pay | Admitting: Emergency Medicine

## 2017-07-21 ENCOUNTER — Emergency Department (HOSPITAL_COMMUNITY)
Admission: EM | Admit: 2017-07-21 | Discharge: 2017-07-21 | Disposition: A | Discharge status: Home / Self Care | Payer: Self-pay | Attending: Emergency Medicine | Admitting: Emergency Medicine

## 2017-07-21 ENCOUNTER — Other Ambulatory Visit: Payer: Self-pay

## 2017-07-21 DIAGNOSIS — K047 Periapical abscess without sinus: Secondary | ICD-10-CM | POA: Insufficient documentation

## 2017-07-21 DIAGNOSIS — Z79899 Other long term (current) drug therapy: Secondary | ICD-10-CM | POA: Insufficient documentation

## 2017-07-21 DIAGNOSIS — I10 Essential (primary) hypertension: Secondary | ICD-10-CM | POA: Insufficient documentation

## 2017-07-21 DIAGNOSIS — F17210 Nicotine dependence, cigarettes, uncomplicated: Secondary | ICD-10-CM | POA: Insufficient documentation

## 2017-07-21 MED ORDER — AMOXICILLIN 500 MG PO CAPS: 500.0000 mg | ORAL_CAPSULE | Freq: Three times a day (TID) | ORAL | 0 refills | Status: DC

## 2017-07-21 MED ORDER — AMOXICILLIN 500 MG PO CAPS
500.0000 mg | ORAL_CAPSULE | Freq: Once | ORAL | Status: AC
  Administered 2017-07-21: 500 mg via ORAL
  Filled 2017-07-21: qty 1

## 2017-07-21 MED ORDER — HYDROCODONE-ACETAMINOPHEN 5-325 MG PO TABS
1.0000 | ORAL_TABLET | Freq: Once | ORAL | Status: AC
  Administered 2017-07-21: 1 via ORAL
  Filled 2017-07-21: qty 1

## 2017-07-21 NOTE — ED Triage Notes (Signed)
Patient c/o front upper gum pain with swelling since yesterday. Denies fevers, N/V/D. Speaking in full sentences without difficulty.

## 2017-07-21 NOTE — ED Provider Notes (Signed)
[facility]-EMERGENCY DEPT Provider Note   CSN: [id number] Arrival date & time: 07/21/17  1951     History   Chief Complaint Chief Complaint  Patient presents with  . Dental Pain    HPI Eric Blanchard is a 36 y.o. male who presents to the ED with dental pain that started 3 days ago. The pain is located in the upper front gum. The swelling started yesterday and is worse today with a raised tender area to the gum. Patient denies fever, chills or other problems.   HPI  Past Medical History:  Diagnosis Date  . Hypertension     There are no active problems to display for this patient.   History reviewed. No pertinent surgical history.     Home Medications    Prior to Admission medications   Medication Sig Start Date End Date Taking? Authorizing Provider  amoxicillin (AMOXIL) 500 MG capsule Take 1 capsule (500 mg total) by mouth 3 (three) times daily. 07/21/17   Janne NapoleonNeese, Tashyra Adduci M, NP  baclofen (LIORESAL) 10 MG tablet Take 0.5-1 tablets (5-10 mg total) by mouth 3 (three) times daily as needed for muscle spasms. 08/06/16   Arthor CaptainHarris, Abigail, PA-C  HYDROcodone-acetaminophen (NORCO/VICODIN) 5-325 MG per tablet Take 2 tablets by mouth every 4 (four) hours as needed. 06/21/14   Elson AreasSofia, Leslie K, PA-C  ibuprofen (ADVIL,MOTRIN) 800 MG tablet Take 1 tablet (800 mg total) by mouth every 8 (eight) hours as needed. 12/27/13   Lawyer, Cristal Deerhristopher, PA-C  lisinopril (PRINIVIL,ZESTRIL) 20 MG tablet Take 1 tablet (20 mg total) by mouth daily. 05/02/14   Nelva NayBeaton, Robert, MD  meloxicam (MOBIC) 15 MG tablet Take 1 tablet (15 mg total) by mouth daily. Take 1 daily with food. 08/06/16   Arthor CaptainHarris, Abigail, PA-C  methocarbamol (ROBAXIN) 500 MG tablet Take 2 tablets (1,000 mg total) by mouth 3 (three) times daily as needed for muscle spasms. 05/18/14   Cathren LaineSteinl, Emanual, MD  predniSONE (DELTASONE) 20 MG tablet 3 tabs po daily x 3 days, then 2 tabs x 3 days, then 1.5 tabs x 3 days, then 1 tab x 3 days,  then 0.5 tabs x 3 days 08/08/16   Arthor CaptainHarris, Abigail, PA-C  traMADol (ULTRAM) 50 MG tablet Take 1 tablet (50 mg total) by mouth every 6 (six) hours as needed. 05/18/14   Cathren LaineSteinl, Sesar, MD    Family History No family history on file.  Social History Social History   Tobacco Use  . Smoking status: Current Every Day Smoker    Packs/day: 0.50    Types: Cigarettes  . Smokeless tobacco: Never Used  Substance Use Topics  . Alcohol use: Yes    Comment: socially  . Drug use: Yes    Types: Marijuana    Comment: last month     Allergies   Patient has no known allergies.   Review of Systems Review of Systems  HENT: Positive for dental problem and facial swelling.   All other systems reviewed and are negative.    Physical Exam Updated Vital Signs BP (!) 151/102 (BP Location: Left Arm)   Pulse 76   Temp 98.5 F (36.9 C) (Oral)   Resp 18   SpO2 99%   Physical Exam  Constitutional: He is oriented to person, place, and time. He appears well-developed and well-nourished. No distress.  HENT:  Mouth/Throat: Oropharynx is clear and moist. Dental abscesses present.  Gum swelling and draining abscess to area above the left central incisor.   Eyes: Conjunctivae  and EOM are normal. Pupils are equal, round, and reactive to light.  Neck: Normal range of motion. Neck supple.  Cardiovascular: Normal rate.  Pulmonary/Chest: Effort normal.  Musculoskeletal: Normal range of motion.  Neurological: He is alert and oriented to person, place, and time. No cranial nerve deficit.  Skin: Skin is warm and dry.  Psychiatric: He has a normal mood and affect.  Nursing note and vitals reviewed.    ED Treatments / Results  Labs (all labs ordered are listed, but only abnormal results are displayed) Labs Reviewed - No data to display  EKG Radiology No results found.  Procedures Procedures (including critical care time)  Medications Ordered in ED Medications  amoxicillin (AMOXIL) capsule 500  mg (not administered)  HYDROcodone-acetaminophen (NORCO/VICODIN) 5-325 MG per tablet 1 tablet (not administered)     Initial Impression / Assessment and Plan / ED Course  I have reviewed the triage vital signs and the nursing notes. Patient with toothache. Draining abscess above left central incisor.  Exam unconcerning for Ludwig's angina or spread of infection.  Will treat with amoxicillin and anti-inflammatories medicine.  Urged patient to follow-up with dentist.  Patient agrees with plan.   Final Clinical Impressions(s) / ED Diagnoses   Final diagnoses:  Dental abscess    ED Discharge Orders        Ordered    amoxicillin (AMOXIL) 500 MG capsule  3 times daily     07/21/17 2132       Kerrie Buffaloeese, Mouhamadou Gittleman Hammond[place], [place] 07/21/17 2137    Rolan BuccoBelfi, Melanie, MD 07/21/17 2243

## 2017-07-21 NOTE — Discharge Instructions (Signed)
Continue to take the ibuprofen in addition to the antibiotic. Follow up with a dentist as soon as possible.

## 2017-07-21 NOTE — ED Notes (Signed)
Pt is alert and oriented x 4 and is verbally responsive. Pt reports 10/10 mouth pain. Pt has an abscess noted to the anterior upper gum. Pt reports that it began 3 days ago. Pt has some facial swelling noted to the left side of his face.

## 2017-07-25 ENCOUNTER — Emergency Department (HOSPITAL_COMMUNITY)
Admission: EM | Admit: 2017-07-25 | Discharge: 2017-07-25 | Disposition: A | Discharge status: Home / Self Care | Payer: Self-pay | Attending: Emergency Medicine | Admitting: Emergency Medicine

## 2017-07-25 ENCOUNTER — Encounter (HOSPITAL_COMMUNITY): Payer: Self-pay | Admitting: Family Medicine

## 2017-07-25 DIAGNOSIS — S40929A Unspecified superficial injury of unspecified upper arm, initial encounter: Secondary | ICD-10-CM | POA: Insufficient documentation

## 2017-07-25 DIAGNOSIS — Y999 Unspecified external cause status: Secondary | ICD-10-CM | POA: Insufficient documentation

## 2017-07-25 DIAGNOSIS — W268XXA Contact with other sharp object(s), not elsewhere classified, initial encounter: Secondary | ICD-10-CM | POA: Insufficient documentation

## 2017-07-25 DIAGNOSIS — Z5321 Procedure and treatment not carried out due to patient leaving prior to being seen by health care provider: Secondary | ICD-10-CM | POA: Insufficient documentation

## 2017-07-25 DIAGNOSIS — Y9389 Activity, other specified: Secondary | ICD-10-CM | POA: Insufficient documentation

## 2017-07-25 DIAGNOSIS — Y929 Unspecified place or not applicable: Secondary | ICD-10-CM | POA: Insufficient documentation

## 2017-07-25 NOTE — ED Triage Notes (Signed)
Patient reports he was putting a fan in the back of his truck. He reports his hand went down and hit the blade. Patient has a laceration to the right anterior hand. Bleeding controlled with minimal pressure.

## 2017-09-04 ENCOUNTER — Other Ambulatory Visit: Payer: Self-pay

## 2017-09-04 ENCOUNTER — Encounter (HOSPITAL_BASED_OUTPATIENT_CLINIC_OR_DEPARTMENT_OTHER): Payer: Self-pay | Admitting: *Deleted

## 2017-09-04 ENCOUNTER — Emergency Department (HOSPITAL_BASED_OUTPATIENT_CLINIC_OR_DEPARTMENT_OTHER)
Admission: EM | Admit: 2017-09-04 | Discharge: 2017-09-04 | Disposition: A | Discharge status: Home / Self Care | Payer: Self-pay | Attending: Emergency Medicine | Admitting: Emergency Medicine

## 2017-09-04 DIAGNOSIS — F17210 Nicotine dependence, cigarettes, uncomplicated: Secondary | ICD-10-CM | POA: Insufficient documentation

## 2017-09-04 DIAGNOSIS — Z23 Encounter for immunization: Secondary | ICD-10-CM | POA: Insufficient documentation

## 2017-09-04 DIAGNOSIS — I10 Essential (primary) hypertension: Secondary | ICD-10-CM | POA: Insufficient documentation

## 2017-09-04 DIAGNOSIS — S61412A Laceration without foreign body of left hand, initial encounter: Secondary | ICD-10-CM | POA: Insufficient documentation

## 2017-09-04 DIAGNOSIS — Y9389 Activity, other specified: Secondary | ICD-10-CM | POA: Insufficient documentation

## 2017-09-04 DIAGNOSIS — Y998 Other external cause status: Secondary | ICD-10-CM | POA: Insufficient documentation

## 2017-09-04 DIAGNOSIS — W268XXA Contact with other sharp object(s), not elsewhere classified, initial encounter: Secondary | ICD-10-CM | POA: Insufficient documentation

## 2017-09-04 DIAGNOSIS — Y929 Unspecified place or not applicable: Secondary | ICD-10-CM | POA: Insufficient documentation

## 2017-09-04 MED ORDER — "THROMBI-PAD 3""X3"" EX PADS"
1.0000 | MEDICATED_PAD | Freq: Once | CUTANEOUS | Status: AC
  Administered 2017-09-04: 1 via TOPICAL

## 2017-09-04 MED ORDER — ACETAMINOPHEN 500 MG PO TABS
1000.0000 mg | ORAL_TABLET | Freq: Once | ORAL | Status: DC
  Filled 2017-09-04: qty 2

## 2017-09-04 MED ORDER — TETANUS-DIPHTH-ACELL PERTUSSIS 5-2.5-18.5 LF-MCG/0.5 IM SUSP
0.5000 mL | Freq: Once | INTRAMUSCULAR | Status: AC
  Administered 2017-09-04: 0.5 mL via INTRAMUSCULAR
  Filled 2017-09-04: qty 0.5

## 2017-09-04 MED ORDER — IBUPROFEN 800 MG PO TABS
800.0000 mg | ORAL_TABLET | Freq: Once | ORAL | Status: DC
  Filled 2017-09-04: qty 1

## 2017-09-04 MED ORDER — "THROMBI-PAD 3""X3"" EX PADS"
MEDICATED_PAD | CUTANEOUS | Status: AC
  Filled 2017-09-04: qty 1

## 2017-09-04 MED ORDER — LIDOCAINE HCL 2 % EX GEL
1.0000 "application " | Freq: Once | CUTANEOUS | Status: AC
  Administered 2017-09-04: 1 via TOPICAL
  Filled 2017-09-04: qty 20

## 2017-09-04 MED ORDER — LIDOCAINE HCL (PF) 1 % IJ SOLN
5.0000 mL | Freq: Once | INTRAMUSCULAR | Status: AC
  Administered 2017-09-04: 5 mL
  Filled 2017-09-04: qty 5

## 2017-09-04 NOTE — ED Provider Notes (Signed)
[facility] EMERGENCY DEPARTMENT Provider Note   CSN: [id number] Arrival date & time: 09/04/17  0000     History   Chief Complaint Chief Complaint  Patient presents with  . Extremity Laceration    HPI Eric Blanchard is a 37 y.o. male.  The history is provided by the patient.  Laceration   The incident occurred 1 to 2 hours ago. Pain location: hand. The laceration is 1 cm in size. The laceration mechanism was a a metal edge. The pain is at a severity of 5/10. The pain is moderate. The pain has been constant since onset. He reports no foreign bodies present. His tetanus status is unknown.  Poked with edge of a clean knife that he washed at home.    Past Medical History:  Diagnosis Date  . Hypertension     There are no active problems to display for this patient.   History reviewed. No pertinent surgical history.     Home Medications    Prior to Admission medications   Medication Sig Start Date End Date Taking? Authorizing Provider  amoxicillin (AMOXIL) 500 MG capsule Take 1 capsule (500 mg total) by mouth 3 (three) times daily. 07/21/17   Janne NapoleonNeese, Hope M, NP  baclofen (LIORESAL) 10 MG tablet Take 0.5-1 tablets (5-10 mg total) by mouth 3 (three) times daily as needed for muscle spasms. 08/06/16   Arthor CaptainHarris, Abigail, PA-C  HYDROcodone-acetaminophen (NORCO/VICODIN) 5-325 MG per tablet Take 2 tablets by mouth every 4 (four) hours as needed. 06/21/14   Elson AreasSofia, Leslie K, PA-C  ibuprofen (ADVIL,MOTRIN) 800 MG tablet Take 1 tablet (800 mg total) by mouth every 8 (eight) hours as needed. 12/27/13   Lawyer, Cristal Deerhristopher, PA-C  lisinopril (PRINIVIL,ZESTRIL) 20 MG tablet Take 1 tablet (20 mg total) by mouth daily. 05/02/14   Nelva NayBeaton, Robert, MD  meloxicam (MOBIC) 15 MG tablet Take 1 tablet (15 mg total) by mouth daily. Take 1 daily with food. 08/06/16   Arthor CaptainHarris, Abigail, PA-C  methocarbamol (ROBAXIN) 500 MG tablet Take 2 tablets (1,000 mg total) by mouth 3 (three) times daily as needed  for muscle spasms. 05/18/14   Cathren LaineSteinl, Cameren, MD  predniSONE (DELTASONE) 20 MG tablet 3 tabs po daily x 3 days, then 2 tabs x 3 days, then 1.5 tabs x 3 days, then 1 tab x 3 days, then 0.5 tabs x 3 days 08/08/16   Arthor CaptainHarris, Abigail, PA-C  traMADol (ULTRAM) 50 MG tablet Take 1 tablet (50 mg total) by mouth every 6 (six) hours as needed. 05/18/14   Cathren LaineSteinl, Janiel, MD    Family History History reviewed. No pertinent family history.  Social History Social History   Tobacco Use  . Smoking status: Current Every Day Smoker    Packs/day: 0.50    Types: Cigarettes  . Smokeless tobacco: Never Used  Substance Use Topics  . Alcohol use: Yes    Comment: Weekends   . Drug use: Yes    Types: Marijuana     Allergies   Patient has no known allergies.   Review of Systems Review of Systems  Respiratory: Negative for shortness of breath.   Cardiovascular: Negative for chest pain.  Skin: Positive for wound.  All other systems reviewed and are negative.    Physical Exam Updated Vital Signs BP (!) 146/100   Pulse 91   Temp 98.5 F (36.9 C)   Resp 16   Ht 5\' 11"  (1.803 m)   Wt 88.9 kg (196 lb)   SpO2 100%  BMI 27.34 kg/m   Physical Exam  Constitutional: He is oriented to person, place, and time. He appears well-developed and well-nourished. No distress.  HENT:  Head: Normocephalic and atraumatic.  Mouth/Throat: No oropharyngeal exudate.  Eyes: Conjunctivae are normal. Pupils are equal, round, and reactive to light.  Neck: Normal range of motion. Neck supple.  Cardiovascular: Normal rate, regular rhythm, normal heart sounds and intact distal pulses.  Pulmonary/Chest: Effort normal and breath sounds normal. No stridor.  Abdominal: Soft. Bowel sounds are normal. He exhibits no mass. There is no tenderness. There is no rebound and no guarding.  Musculoskeletal: Normal range of motion.       Arms:      Left hand: He exhibits laceration. He exhibits normal range of motion, no tenderness,  no bony tenderness, normal two-point discrimination, normal capillary refill, no deformity and no swelling. Normal sensation noted. Normal strength noted.  Neurological: He is alert and oriented to person, place, and time. He displays normal reflexes.  Skin: Skin is warm and dry. Capillary refill takes less than 2 seconds.     ED Treatments / Results   Procedures .Marland KitchenLaceration Repair Date/Time: 09/04/2017 2:16 AM Performed by: Cy Blamer, MD Authorized by: Cy Blamer, MD   Consent:    Consent obtained:  Verbal   Risks discussed:  Infection, need for additional repair, nerve damage, poor wound healing, poor cosmetic result, pain, retained foreign body, tendon damage and vascular damage   Alternatives discussed:  No treatment Anesthesia (see MAR for exact dosages):    Anesthesia method:  None Laceration details:    Location: web space between small and ring finger left hand.   Length (cm):  0.9   Depth (mm):  2 Repair type:    Repair type:  Complex Exploration:    Hemostasis achieved with:  Direct pressure   Wound exploration: wound explored through full range of motion     Wound extent: no areolar tissue violation noted, no fascia violation noted, no foreign bodies/material noted, no muscle damage noted, no nerve damage noted, no tendon damage noted, no underlying fracture noted and no vascular damage noted     Contaminated: no   Treatment:    Area cleansed with:  Betadine and saline   Amount of cleaning:  Extensive   Irrigation solution:  Sterile saline   Debridement:  None   Undermining:  Minimal   Scar revision: no   Skin repair:    Repair method:  Sutures and tissue adhesive (dorsal aspect tacked with dermabond)   Suture size:  4-0   Suture material:  Nylon   Suture technique:  Simple interrupted   Number of sutures:  2 Approximation:    Approximation:  Close   Vermilion border: well-aligned   Post-procedure details:    Dressing:  Sterile dressing Comments:       V shaped laceration and loss of tissue palmar aspect 2 sutures palmar aspect, dorsal aspect is more superficial and tacked down with dermabond   (including critical care time)  Medications Ordered in ED Medications  acetaminophen (TYLENOL) tablet 1,000 mg (1,000 mg Oral Refused 09/04/17 0110)  ibuprofen (ADVIL,MOTRIN) tablet 800 mg (800 mg Oral Refused 09/04/17 0110)  THROMBI-PAD 3"X3" pad 1 each (not administered)  Tdap (BOOSTRIX) injection 0.5 mL (0.5 mLs Intramuscular Given 09/04/17 0012)  lidocaine (PF) (XYLOCAINE) 1 % injection 5 mL (5 mLs Other Given 09/04/17 0012)  lidocaine (XYLOCAINE) 2 % jelly 1 application (1 application Topical Given 09/04/17 0110)  Final Clinical Impressions(s) / ED Diagnoses   Final diagnoses:  Laceration of left hand without foreign body, initial encounter    Suture removal at urgent care in 7 days.  Keep clean and covered.    Return for weakness, numbness, changes in vision or speech,  fevers > 100.4 unrelieved by medication, shortness of breath, intractable vomiting, or diarrhea, abdominal pain, Inability to tolerate liquids or food, cough, altered mental status or any concerns. No signs of systemic illness or infection. The patient is nontoxic-appearing on exam and vital signs are within normal limits.    I have reviewed the triage vital signs and the nursing notes. Pertinent labs &imaging results that were available during my care of the patient were reviewed by me and considered in my medical decision making (see chart for details).  After history, exam, and medical workup I feel the patient has been appropriately medically screened and is safe for discharge home. Pertinent diagnoses were discussed with the patient. Patient was given return precautions.   Bernis Schreur, MD 09/04/17 [id number]

## 2017-09-04 NOTE — ED Triage Notes (Signed)
Pt c/o lac to left hand by knife while washing dishes x 1 hr ago

## 2017-09-04 NOTE — ED Notes (Signed)
Pt understood dc material. NAD noted. Follow up discussed with suture removal

## 2017-11-28 ENCOUNTER — Other Ambulatory Visit: Payer: Self-pay

## 2017-11-28 ENCOUNTER — Encounter (HOSPITAL_BASED_OUTPATIENT_CLINIC_OR_DEPARTMENT_OTHER): Payer: Self-pay

## 2017-11-28 ENCOUNTER — Emergency Department (HOSPITAL_BASED_OUTPATIENT_CLINIC_OR_DEPARTMENT_OTHER)
Admission: EM | Admit: 2017-11-28 | Discharge: 2017-11-28 | Disposition: A | Discharge status: Home / Self Care | Payer: BLUE CROSS/BLUE SHIELD | Attending: Emergency Medicine | Admitting: Emergency Medicine

## 2017-11-28 DIAGNOSIS — R360 Urethral discharge without blood: Secondary | ICD-10-CM | POA: Insufficient documentation

## 2017-11-28 DIAGNOSIS — R300 Dysuria: Secondary | ICD-10-CM | POA: Diagnosis not present

## 2017-11-28 DIAGNOSIS — F17210 Nicotine dependence, cigarettes, uncomplicated: Secondary | ICD-10-CM | POA: Insufficient documentation

## 2017-11-28 DIAGNOSIS — Z79899 Other long term (current) drug therapy: Secondary | ICD-10-CM | POA: Insufficient documentation

## 2017-11-28 DIAGNOSIS — Z202 Contact with and (suspected) exposure to infections with a predominantly sexual mode of transmission: Secondary | ICD-10-CM | POA: Insufficient documentation

## 2017-11-28 DIAGNOSIS — I10 Essential (primary) hypertension: Secondary | ICD-10-CM | POA: Diagnosis not present

## 2017-11-28 LAB — URINALYSIS, ROUTINE W REFLEX MICROSCOPIC
BILIRUBIN URINE: NEGATIVE
Glucose, UA: NEGATIVE mg/dL
Hgb urine dipstick: NEGATIVE
KETONES UR: NEGATIVE mg/dL
NITRITE: NEGATIVE
PROTEIN: NEGATIVE mg/dL
Specific Gravity, Urine: 1.025 (ref 1.005–1.030)
pH: 6 (ref 5.0–8.0)

## 2017-11-28 LAB — URINALYSIS, MICROSCOPIC (REFLEX)

## 2017-11-28 MED ORDER — CEFTRIAXONE SODIUM 250 MG IJ SOLR
250.0000 mg | Freq: Once | INTRAMUSCULAR | Status: AC
  Administered 2017-11-28: 250 mg via INTRAMUSCULAR
  Filled 2017-11-28: qty 250

## 2017-11-28 MED ORDER — AZITHROMYCIN 250 MG PO TABS
1000.0000 mg | ORAL_TABLET | Freq: Once | ORAL | Status: AC
  Administered 2017-11-28: 1000 mg via ORAL
  Filled 2017-11-28: qty 4

## 2017-11-28 NOTE — ED Triage Notes (Signed)
C/o groin pain and d/c x 3 days-NAD-steady gait

## 2017-11-28 NOTE — Discharge Instructions (Signed)
Please read and follow all provided instructions.  Your diagnoses today include:  1. Dysuria     Tests performed today include:  Test for gonorrhea and chlamydia.   You will be notified by telephone with any positive results.   Vital signs. See below for your results today.   Medications:  You were treated with azithromycin and rocephin today. These antibiotics treat you for gonorrhea and chlamydia. They do not treat for HIV or syphilis.   Home care instructions:  Read educational materials contained in this packet and follow any instructions provided.   You should tell your partners about your infection and avoid having sex for one week to allow time for the medicine to work.  Sexually transmitted disease testing also available at:   [facility], [facility]  [address] [place], phone [phone] or [phone]    Monday - Friday, call for an appointment  [facility], [facility]  [address], [place], phone [phone] or [phone]   Monday - Friday, call for an appointment  Return instructions:   Please return to the Emergency Department if you experience worsening symptoms.   Please return if you have any other emergent concerns.  Additional Information:  Your vital signs today were: BP (!) 161/113 Comment: somtimes takes BP medication    Pulse 93    Temp 98.9 F (37.2 C) (Oral)    Resp 18    Ht  (1.702 m)    Wt 86.6 kg (190 lb 14.7 oz)    SpO2 100%    BMI 29.90 kg/m  If your blood pressure (BP) was elevated above 135/85 this visit, please have this repeated by your doctor within one month. --------------

## 2017-11-28 NOTE — ED Provider Notes (Signed)
[facility] EMERGENCY DEPARTMENT Provider Note   CSN: [id number] Arrival date & time: 11/28/17  1341     History   Chief Complaint Chief Complaint  Patient presents with  . Groin Pain    HPI Eric Blanchard is a 37 y.o. male.  Patient presents with 2 to 3 days of dysuria after having unprotected sexual intercourse.  He has noted scant amount of drainage is in his underwear.  No testicular pain or swelling.  No sore throat or fever.  Patient is concerned about a sexual transmitted infection.  No other complaints.  No treatments prior to arrival.     Past Medical History:  Diagnosis Date  . Hypertension     There are no active problems to display for this patient.   History reviewed. No pertinent surgical history.      Home Medications    Prior to Admission medications   Medication Sig Start Date End Date Taking? Authorizing Provider  amoxicillin (AMOXIL) 500 MG capsule Take 1 capsule (500 mg total) by mouth 3 (three) times daily. 07/21/17   Janne Napoleon, NP  baclofen (LIORESAL) 10 MG tablet Take 0.5-1 tablets (5-10 mg total) by mouth 3 (three) times daily as needed for muscle spasms. 08/06/16   Arthor Captain, PA-C  HYDROcodone-acetaminophen (NORCO/VICODIN) 5-325 MG per tablet Take 2 tablets by mouth every 4 (four) hours as needed. 06/21/14   Elson Areas, PA-C  ibuprofen (ADVIL,MOTRIN) 800 MG tablet Take 1 tablet (800 mg total) by mouth every 8 (eight) hours as needed. 12/27/13   Lawyer, Cristal Deer, PA-C  lisinopril (PRINIVIL,ZESTRIL) 20 MG tablet Take 1 tablet (20 mg total) by mouth daily. 05/02/14   Nelva Nay, MD  meloxicam (MOBIC) 15 MG tablet Take 1 tablet (15 mg total) by mouth daily. Take 1 daily with food. 08/06/16   Arthor Captain, PA-C  methocarbamol (ROBAXIN) 500 MG tablet Take 2 tablets (1,000 mg total) by mouth 3 (three) times daily as needed for muscle spasms. 05/18/14   Cathren Laine, MD  predniSONE (DELTASONE) 20 MG tablet 3 tabs po daily x  3 days, then 2 tabs x 3 days, then 1.5 tabs x 3 days, then 1 tab x 3 days, then 0.5 tabs x 3 days 08/08/16   Arthor Captain, PA-C  traMADol (ULTRAM) 50 MG tablet Take 1 tablet (50 mg total) by mouth every 6 (six) hours as needed. 05/18/14   Cathren Laine, MD    Family History No family history on file.  Social History Social History   Tobacco Use  . Smoking status: Current Every Day Smoker    Packs/day: 0.50    Types: Cigarettes  . Smokeless tobacco: Never Used  Substance Use Topics  . Alcohol use: Yes    Comment: occ  . Drug use: Not Currently     Allergies   Patient has no known allergies.   Review of Systems Review of Systems  Constitutional: Negative for fever.  HENT: Negative for sore throat.   Eyes: Negative for discharge.  Gastrointestinal: Negative for rectal pain.  Genitourinary: Positive for discharge and dysuria. Negative for frequency, genital sores, penile pain and testicular pain.  Musculoskeletal: Negative for arthralgias.  Skin: Negative for rash.  Hematological: Negative for adenopathy.     Physical Exam Updated Vital Signs BP (!) 161/113 Comment: somtimes takes BP medication   Pulse 93   Temp 98.9 F (37.2 C) (Oral)   Resp 18   Ht  (1.702 m)   Wt 86.6  kg (190 lb 14.7 oz)   SpO2 100%   BMI 29.90 kg/m   Physical Exam  Constitutional: He appears well-developed and well-nourished.  HENT:  Head: Normocephalic and atraumatic.  Eyes: Conjunctivae are normal.  Neck: Normal range of motion. Neck supple.  Pulmonary/Chest: No respiratory distress.  Genitourinary: Penis normal. Right testis shows no mass and no tenderness. Left testis shows no mass and no tenderness. No penile erythema or penile tenderness. No discharge found.  Neurological: He is alert.  Skin: Skin is warm and dry.  Psychiatric: He has a normal mood and affect.  Nursing note and vitals reviewed.    ED Treatments / Results  Labs (all labs ordered are listed, but only  abnormal results are displayed) Labs Reviewed  URINALYSIS, ROUTINE W REFLEX MICROSCOPIC - Abnormal; Notable for the following components:      Result Value   Leukocytes, UA TRACE (*)    All other components within normal limits  URINALYSIS, MICROSCOPIC (REFLEX) - Abnormal; Notable for the following components:   Bacteria, UA RARE (*)    All other components within normal limits  GC/CHLAMYDIA PROBE AMP ([place]) NOT AT [facility]    EKG None  Radiology No results found.  Procedures Procedures (including critical care time)  Medications Ordered in ED Medications  cefTRIAXone (ROCEPHIN) injection 250 mg (250 mg Intramuscular Given 11/28/17 1422)  azithromycin (ZITHROMAX) tablet 1,000 mg (1,000 mg Oral Given 11/28/17 1421)     Initial Impression / Assessment and Plan / ED Course  I have reviewed the triage vital signs and the nursing notes.  Pertinent labs & imaging results that were available during my care of the patient were reviewed by me and considered in my medical decision making (see chart for details).     Patient seen and examined.  Will test and treat for GC and chlamydia.  Offered HIV and syphilis testing.  Patient declined stating that he recently had these.  Vital signs reviewed and are as follows: BP (!) 161/113 Comment: somtimes takes BP medication   Pulse 93   Temp 98.9 F (37.2 C) (Oral)   Resp 18   Ht  (1.702 m)   Wt 86.6 kg (190 lb 14.7 oz)   SpO2 100%   BMI 29.90 kg/m   Urine reviewed.  No evidence of trichomonas.  Final Clinical Impressions(s) / ED Diagnoses   Final diagnoses:  Dysuria   Patient with dysuria and scant discharge likely related to sexually transmitted infection.  Patient tested and treated.  He declines HIV and RPR.  ED Discharge Orders    None       Renne Crigler, Cordelia Poche 11/28/17 1514    Nira Conn, MD 11/28/17 [phone]

## 2017-11-28 NOTE — ED Notes (Signed)
Pt verbalizes understanding of d/c instructions and denies any further needs at this time. 

## 2017-11-29 LAB — GC/CHLAMYDIA PROBE AMP (~~LOC~~) NOT AT ARMC
Chlamydia: NEGATIVE
Neisseria Gonorrhea: NEGATIVE

## 2018-05-13 ENCOUNTER — Encounter (HOSPITAL_BASED_OUTPATIENT_CLINIC_OR_DEPARTMENT_OTHER): Payer: Self-pay | Admitting: *Deleted

## 2018-05-13 ENCOUNTER — Other Ambulatory Visit: Payer: Self-pay

## 2018-05-13 ENCOUNTER — Emergency Department (HOSPITAL_BASED_OUTPATIENT_CLINIC_OR_DEPARTMENT_OTHER)
Admission: EM | Admit: 2018-05-13 | Discharge: 2018-05-14 | Disposition: A | Discharge status: Home / Self Care | Payer: BLUE CROSS/BLUE SHIELD | Attending: Emergency Medicine | Admitting: Emergency Medicine

## 2018-05-13 ENCOUNTER — Emergency Department (HOSPITAL_BASED_OUTPATIENT_CLINIC_OR_DEPARTMENT_OTHER): Payer: BLUE CROSS/BLUE SHIELD

## 2018-05-13 DIAGNOSIS — F17210 Nicotine dependence, cigarettes, uncomplicated: Secondary | ICD-10-CM | POA: Insufficient documentation

## 2018-05-13 DIAGNOSIS — R600 Localized edema: Secondary | ICD-10-CM | POA: Insufficient documentation

## 2018-05-13 DIAGNOSIS — I10 Essential (primary) hypertension: Secondary | ICD-10-CM | POA: Insufficient documentation

## 2018-05-13 DIAGNOSIS — Z79899 Other long term (current) drug therapy: Secondary | ICD-10-CM | POA: Insufficient documentation

## 2018-05-13 DIAGNOSIS — M7989 Other specified soft tissue disorders: Secondary | ICD-10-CM

## 2018-05-13 NOTE — ED Triage Notes (Signed)
Wooden splinter in left pointer finger x 1 week. Swelling noted.

## 2018-05-14 MED ORDER — CEPHALEXIN 500 MG PO CAPS: 500.0000 mg | ORAL_CAPSULE | Freq: Four times a day (QID) | ORAL | 0 refills | Status: DC

## 2018-05-14 NOTE — Discharge Instructions (Addendum)
You were seen today for concern for splinter in the left second digit and swelling.  There is no obvious foreign body.  Sometimes you can get infections; although it is not clear that you have an infection.  Soak finger in warm water 2-3 times daily.  Take antibiotics as prescribed.  Monitor for worsening swelling, redness or drainage.  If you develop any of these you need to be reevaluated.

## 2018-05-14 NOTE — ED Provider Notes (Signed)
[facility] EMERGENCY DEPARTMENT Provider Note   CSN: [id number] Arrival date & time: 05/13/18  2249     History   Chief Complaint Chief Complaint  Patient presents with  . Foreign Body in Skin    HPI JOHNNIE GOYNES is a 37 y.o. male.  HPI  This is a 37 year old male who presents with concerns for foreign body in the left second digit.  Patient reports 2 weeks ago he believes he got a splinter in his left second digit.  He is right-handed.  He states he pulled some of it out but does not think he got all of it.  He has had some residual swelling there.  He reports some mild pain.  No drainage.  Has not noted any redness.  Patient states that he hit his finger tonight and had some increasing pain and this is why he sought treatment.  Past Medical History:  Diagnosis Date  . Hypertension     There are no active problems to display for this patient.   History reviewed. No pertinent surgical history.      Home Medications    Prior to Admission medications   Medication Sig Start Date End Date Taking? Authorizing Provider  amoxicillin (AMOXIL) 500 MG capsule Take 1 capsule (500 mg total) by mouth 3 (three) times daily. 07/21/17  Yes Neese, Hope M, NP  baclofen (LIORESAL) 10 MG tablet Take 0.5-1 tablets (5-10 mg total) by mouth 3 (three) times daily as needed for muscle spasms. 08/06/16  Yes Harris, Cammy Copa, PA-C  HYDROcodone-acetaminophen (NORCO/VICODIN) 5-325 MG per tablet Take 2 tablets by mouth every 4 (four) hours as needed. 06/21/14  Yes Cheron Schaumann K, PA-C  ibuprofen (ADVIL,MOTRIN) 800 MG tablet Take 1 tablet (800 mg total) by mouth every 8 (eight) hours as needed. 12/27/13  Yes Lawyer, Cristal Deer, PA-C  lisinopril (PRINIVIL,ZESTRIL) 20 MG tablet Take 1 tablet (20 mg total) by mouth daily. 05/02/14  Yes Nelva Nay, MD  meloxicam (MOBIC) 15 MG tablet Take 1 tablet (15 mg total) by mouth daily. Take 1 daily with food. 08/06/16  Yes Harris, Abigail, PA-C    methocarbamol (ROBAXIN) 500 MG tablet Take 2 tablets (1,000 mg total) by mouth 3 (three) times daily as needed for muscle spasms. 05/18/14  Yes Cathren Laine, MD  predniSONE (DELTASONE) 20 MG tablet 3 tabs po daily x 3 days, then 2 tabs x 3 days, then 1.5 tabs x 3 days, then 1 tab x 3 days, then 0.5 tabs x 3 days 08/08/16  Yes Harris, Abigail, PA-C  traMADol (ULTRAM) 50 MG tablet Take 1 tablet (50 mg total) by mouth every 6 (six) hours as needed. 05/18/14  Yes Cathren Laine, MD  cephALEXin (KEFLEX) 500 MG capsule Take 1 capsule (500 mg total) by mouth 4 (four) times daily. 05/14/18   Horton, Mayer Masker, MD    Family History History reviewed. No pertinent family history.  Social History Social History   Tobacco Use  . Smoking status: Current Every Day Smoker    Packs/day: 0.50    Types: Cigarettes  . Smokeless tobacco: Never Used  Substance Use Topics  . Alcohol use: Yes    Comment: occ  . Drug use: Not Currently     Allergies   Patient has no known allergies.   Review of Systems Review of Systems  Skin: Positive for wound. Negative for color change.  Neurological: Negative for numbness.  All other systems reviewed and are negative.    Physical Exam Updated  Vital Signs BP (!) 149/102 (BP Location: Left Arm)   Pulse 81   Temp 98.7 F (37.1 C) (Oral)   Resp 18   Ht 1.803 m (5\' 11" )   Wt 88.5 kg   SpO2 99%   BMI 27.20 kg/m   Physical Exam  Constitutional: He is oriented to person, place, and time. He appears well-developed and well-nourished. No distress.  HENT:  Head: Normocephalic and atraumatic.  Cardiovascular: Normal rate and regular rhythm.  Pulmonary/Chest: Effort normal. No respiratory distress. He has no wheezes.  Musculoskeletal: He exhibits no edema.  Focused examination of the left hand reveals healing callus over the lateral aspect of the left second digit just adjacent to the nailbed, slight swelling noted, no fluctuance, no erythema, no tenderness  over the pad of the finger, normal range of motion, no obvious palpable foreign body  Neurological: He is alert and oriented to person, place, and time.  Skin: Skin is warm and dry.  See above  Psychiatric: He has a normal mood and affect.  Nursing note and vitals reviewed.    ED Treatments / Results  Labs (all labs ordered are listed, but only abnormal results are displayed) Labs Reviewed - No data to display  EKG None  Radiology Dg Finger Index Left  Result Date: 05/13/2018 CLINICAL DATA:  Foreign body in scan EXAM: LEFT INDEX FINGER 2+V COMPARISON:  None. FINDINGS: Soft tissue swelling within the distal left index finger. No visible radiopaque foreign body. No fracture, subluxation or dislocation. IMPRESSION: Soft tissue swelling at the tip of the left index finger. No radiopaque foreign body or acute bony abnormality. Electronically Signed   By: Charlett Nose M.D.   On: 05/13/2018 23:37    Procedures Procedures (including critical care time)  Medications Ordered in ED Medications - No data to display   Initial Impression / Assessment and Plan / ED Course  I have reviewed the triage vital signs and the nursing notes.  Pertinent labs & imaging results that were available during my care of the patient were reviewed by me and considered in my medical decision making (see chart for details).     Patient presents with concern for retained splinter in the left digit.  He reports that he noted it over 2 weeks ago.  On exam there is no obvious infection.  No obvious foreign body.  He has a healing callus over the lateral aspect of the finger.  No tenderness over the pad to suggest a felon and no obvious paronychia.  X-rays negative for foreign body.  Would hesitate to open up at this time given that it appears to be healing with a callus.  There is only slight swelling.  I have encouraged patient to soak the finger.  I will cover with antibiotics.  If he notes increasing swelling,  increasing pain, drainage he should be reevaluated.  After history, exam, and medical workup I feel the patient has been appropriately medically screened and is safe for discharge home. Pertinent diagnoses were discussed with the patient. Patient was given return precautions.   Final Clinical Impressions(s) / ED Diagnoses   Final diagnoses:  Finger swelling    ED Discharge Orders         Ordered    cephALEXin (KEFLEX) 500 MG capsule  4 times daily     05/14/18 0102           Horton, Mayer Masker, MD 05/14/18 0107

## 2018-05-18 ENCOUNTER — Ambulatory Visit (HOSPITAL_COMMUNITY)
Admission: EM | Admit: 2018-05-18 | Discharge: 2018-05-18 | Disposition: A | Discharge status: Home / Self Care | Payer: BLUE CROSS/BLUE SHIELD | Attending: Family Medicine | Admitting: Family Medicine

## 2018-05-18 ENCOUNTER — Encounter (HOSPITAL_COMMUNITY): Payer: Self-pay | Admitting: Emergency Medicine

## 2018-05-18 DIAGNOSIS — M542 Cervicalgia: Secondary | ICD-10-CM | POA: Diagnosis not present

## 2018-05-18 DIAGNOSIS — M546 Pain in thoracic spine: Secondary | ICD-10-CM

## 2018-05-18 MED ORDER — CYCLOBENZAPRINE HCL 5 MG PO TABS: 5.0000 mg | ORAL_TABLET | Freq: Every evening | ORAL | 0 refills | Status: DC | PRN

## 2018-05-18 MED ORDER — MELOXICAM 7.5 MG PO TABS: 7.5000 mg | ORAL_TABLET | Freq: Every day | ORAL | 0 refills | Status: DC
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
## 2018-09-25 ENCOUNTER — Encounter: Payer: Self-pay | Admitting: *Deleted

## 2018-09-25 ENCOUNTER — Other Ambulatory Visit: Payer: Self-pay

## 2018-09-25 ENCOUNTER — Emergency Department
Admission: EM | Admit: 2018-09-25 | Discharge: 2018-09-25 | Disposition: A | Discharge status: Home / Self Care | Payer: BLUE CROSS/BLUE SHIELD | Attending: Emergency Medicine | Admitting: Emergency Medicine

## 2018-09-25 DIAGNOSIS — F17210 Nicotine dependence, cigarettes, uncomplicated: Secondary | ICD-10-CM | POA: Diagnosis not present

## 2018-09-25 DIAGNOSIS — Z202 Contact with and (suspected) exposure to infections with a predominantly sexual mode of transmission: Secondary | ICD-10-CM | POA: Diagnosis not present

## 2018-09-25 DIAGNOSIS — I10 Essential (primary) hypertension: Secondary | ICD-10-CM | POA: Insufficient documentation

## 2018-09-25 DIAGNOSIS — R369 Urethral discharge, unspecified: Secondary | ICD-10-CM | POA: Diagnosis present

## 2018-09-25 LAB — URINALYSIS, COMPLETE (UACMP) WITH MICROSCOPIC
Bacteria, UA: NONE SEEN
Bilirubin Urine: NEGATIVE
GLUCOSE, UA: NEGATIVE mg/dL
HGB URINE DIPSTICK: NEGATIVE
Ketones, ur: NEGATIVE mg/dL
Leukocytes,Ua: NEGATIVE
NITRITE: NEGATIVE
Protein, ur: NEGATIVE mg/dL
SPECIFIC GRAVITY, URINE: 1.021 (ref 1.005–1.030)
Squamous Epithelial / LPF: NONE SEEN (ref 0–5)
pH: 6 (ref 5.0–8.0)

## 2018-09-25 LAB — CHLAMYDIA/NGC RT PCR (ARMC ONLY)
Chlamydia Tr: NOT DETECTED
N GONORRHOEAE: NOT DETECTED

## 2018-09-25 MED ORDER — CEFTRIAXONE SODIUM 250 MG IJ SOLR
250.0000 mg | Freq: Once | INTRAMUSCULAR | Status: AC
  Administered 2018-09-25: 250 mg via INTRAMUSCULAR
  Filled 2018-09-25: qty 250

## 2018-09-25 MED ORDER — AZITHROMYCIN 500 MG PO TABS
1000.0000 mg | ORAL_TABLET | Freq: Once | ORAL | Status: AC
  Administered 2018-09-25: 1000 mg via ORAL
  Filled 2018-09-25: qty 2

## 2018-09-25 MED ORDER — METRONIDAZOLE 500 MG PO TABS
2000.0000 mg | ORAL_TABLET | Freq: Once | ORAL | Status: AC
  Administered 2018-09-25: 2000 mg via ORAL
  Filled 2018-09-25: qty 4

## 2018-09-25 NOTE — Discharge Instructions (Signed)
Wear protection in the form of condoms to prevent STDs. No sexual intercourse for the next 3 weeks until repeat gonorrhea, chlamydia and trichomoniasis testing.

## 2018-09-25 NOTE — ED Triage Notes (Signed)
Pt reports penile discharge since Friday.

## 2018-09-25 NOTE — ED Provider Notes (Signed)
[facility] Emergency Department Provider Note  ____________________________________________  Time seen: Approximately 10:57 PM  I have reviewed the triage vital signs and the nursing notes.   HISTORY  Chief Complaint Penile Discharge    HPI Eric Blanchard is a 38 y.o. male presents to the emergency department with penile discharge for approximately 1 week.  Patient has also had dysuria and increased urinary frequency.  Patient reports that he had a STD approximately 6 months ago and reports that he has been having sex without protection.  No fever, chills, flank pain or impotence.  No other alleviating measures have been attempted.   Past Medical History:  Diagnosis Date  . Hypertension     There are no active problems to display for this patient.   History reviewed. No pertinent surgical history.  Prior to Admission medications   Medication Sig Start Date End Date Taking? Authorizing Provider  cyclobenzaprine (FLEXERIL) 5 MG tablet Take 1 tablet (5 mg total) by mouth at bedtime as needed for muscle spasms. 05/18/18   Cathie Hoops, Amy V, PA-C  meloxicam (MOBIC) 7.5 MG tablet Take 1 tablet (7.5 mg total) by mouth daily. 05/18/18   Belinda Fisher, PA-C    Allergies Patient has no known allergies.  No family history on file.  Social History Social History   Tobacco Use  . Smoking status: Current Every Day Smoker    Packs/day: 0.50    Types: Cigarettes  . Smokeless tobacco: Never Used  Substance Use Topics  . Alcohol use: Yes    Comment: occ  . Drug use: Not Currently     Review of Systems  Constitutional: No fever/chills Eyes: No visual changes. No discharge ENT: No upper respiratory complaints. Cardiovascular: no chest pain. Respiratory: no cough. No SOB. Gastrointestinal: No abdominal pain.  No nausea, no vomiting.  No diarrhea.  No constipation. Genitourinary: Patient has dysuria and increased urinary frequency.  Musculoskeletal: Negative for  musculoskeletal pain. Skin: Negative for rash, abrasions, lacerations, ecchymosis. Neurological: Negative for headaches, focal weakness or numbness.   ____________________________________________   PHYSICAL EXAM:  VITAL SIGNS: ED Triage Vitals  Enc Vitals Group     BP 09/25/18 1907 (!) 167/109     Pulse Rate 09/25/18 1907 77     Resp 09/25/18 1907 18     Temp 09/25/18 1907 98.4 F (36.9 C)     Temp Source 09/25/18 1907 Oral     SpO2 09/25/18 1907 98 %     Weight 09/25/18 1908 189 lb (85.7 kg)     Height 09/25/18 1908 5\' 11"  (1.803 m)     Head Circumference --      Peak Flow --      Pain Score 09/25/18 1910 0     Pain Loc --      Pain Edu? --      Excl. in GC? --      Constitutional: Alert and oriented. Well appearing and in no acute distress. Eyes: Conjunctivae are normal. PERRL. EOMI. Head: Atraumatic. Cardiovascular: Normal rate, regular rhythm. Normal S1 and S2.  Good peripheral circulation. Respiratory: Normal respiratory effort without tachypnea or retractions. Lungs CTAB. Good air entry to the bases with no decreased or absent breath sounds. Gastrointestinal: Bowel sounds 4 quadrants. Soft and nontender to palpation. No guarding or rigidity. No palpable masses. No distention. No CVA tenderness. Musculoskeletal: Full range of motion to all extremities. No gross deformities appreciated. Neurologic:  Normal speech and language. No gross focal neurologic deficits are  appreciated.  Skin:  Skin is warm, dry and intact. No rash noted. Psychiatric: Mood and affect are normal. Speech and behavior are normal. Patient exhibits appropriate insight and judgement.   ____________________________________________   LABS (all labs ordered are listed, but only abnormal results are displayed)  Labs Reviewed  URINALYSIS, COMPLETE (UACMP) WITH MICROSCOPIC - Abnormal; Notable for the following components:      Result Value   Color, Urine YELLOW (*)    APPearance CLEAR (*)    All  other components within normal limits  CHLAMYDIA/NGC RT PCR ([facility] ONLY)   ____________________________________________  EKG   ____________________________________________  RADIOLOGY  No results found.  ____________________________________________    PROCEDURES  Procedure(s) performed:    Procedures    Medications  cefTRIAXone (ROCEPHIN) injection 250 mg (250 mg Intramuscular Given 09/25/18 2135)  azithromycin (ZITHROMAX) tablet 1,000 mg (1,000 mg Oral Given 09/25/18 2134)  metroNIDAZOLE (FLAGYL) tablet 2,000 mg (2,000 mg Oral Given 09/25/18 2133)     ____________________________________________   INITIAL IMPRESSION / ASSESSMENT AND PLAN / ED COURSE  Pertinent labs & imaging results that were available during my care of the patient were reviewed by me and considered in my medical decision making (see chart for details).  Review of the [place] CSRS was performed in accordance of the [facility] prior to dispensing any controlled drugs.      Assessment and plan STD exposure Patient presents to the emergency department with dysuria and increased urinary frequency for approximately 1 week with penile discharge and recent history of unprotected sex.  Patient was treated empirically for gonorrhea, chlamydia and trichomoniasis with Rocephin, azithromycin and Flagyl.  He was advised to abstain from sexual intercourse for the next 3 weeks and to have repeat testing at local health department.  Patient voiced understanding regarding these recommendations.  All patient questions were answered.    ____________________________________________  FINAL CLINICAL IMPRESSION(S) / ED DIAGNOSES  Final diagnoses:  STD exposure      NEW MEDICATIONS STARTED DURING THIS VISIT:  ED Discharge Orders    None          This chart was dictated using voice recognition software/Dragon. Despite best efforts to proofread, errors can occur which can change the meaning. Any change was  purely unintentional.    Orvil Feil, PA-C 09/25/18 2300    Jeanmarie Plant, MD 09/25/18 [phone]

## 2019-03-26 IMAGING — DX DG FINGER INDEX 2+V*L*
3 series · 3 of 3 positions shown · non-contrast
Comparison: None.

CLINICAL DATA: Foreign body in scan

EXAM:
LEFT INDEX FINGER 2+V

[finger ap]
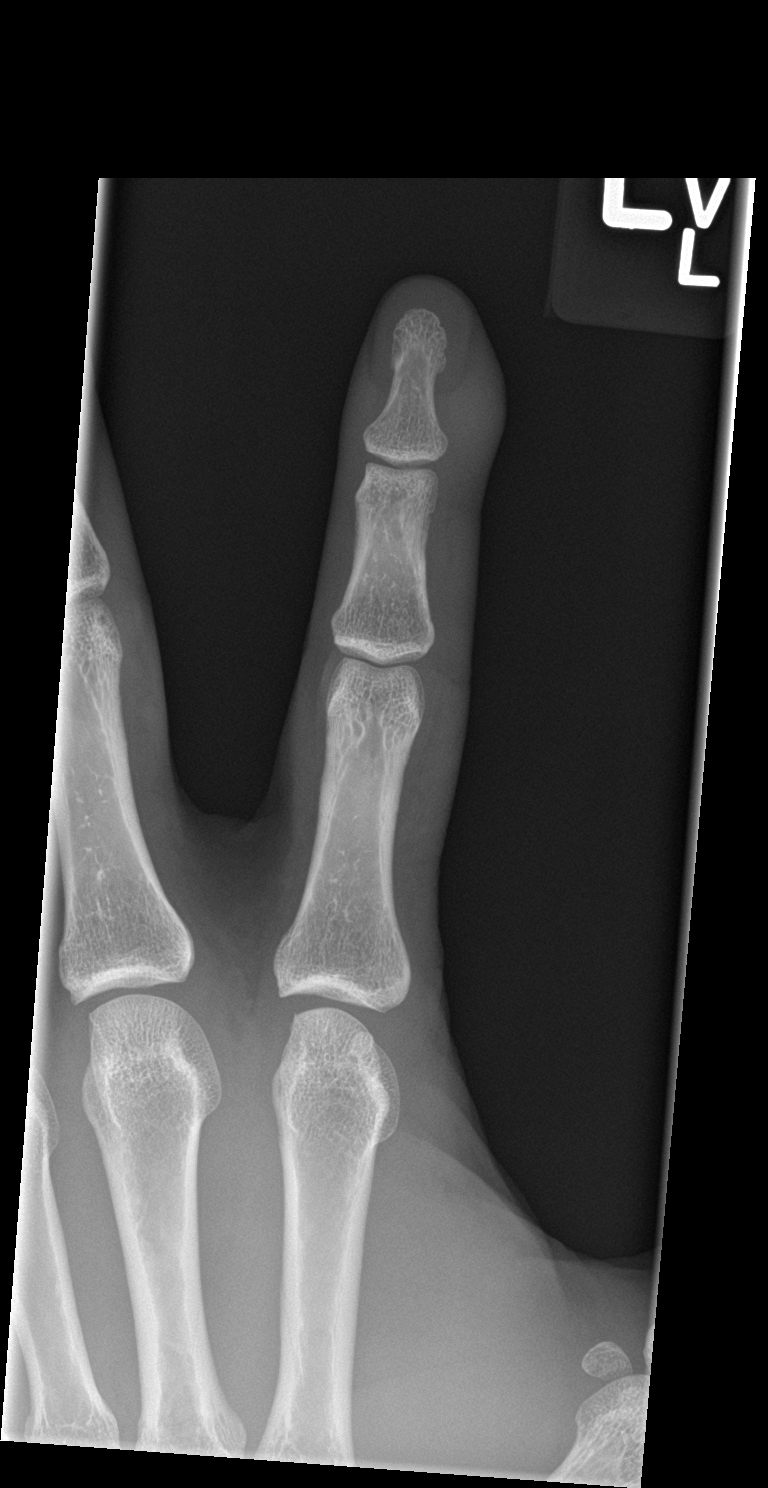

[finger obl]
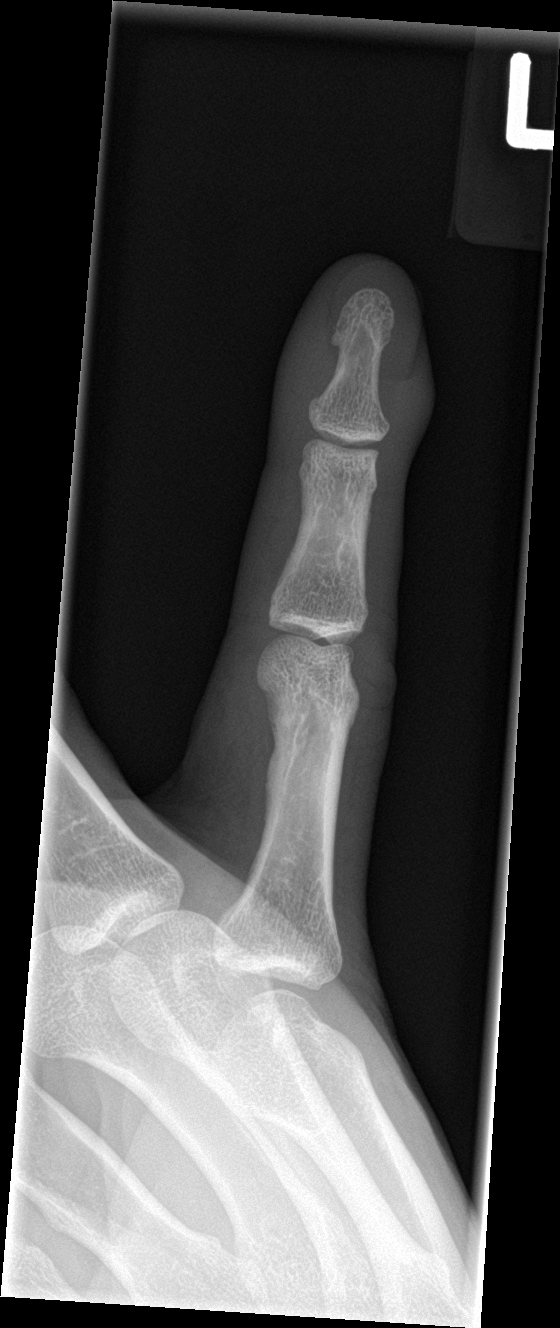

[finger lat]
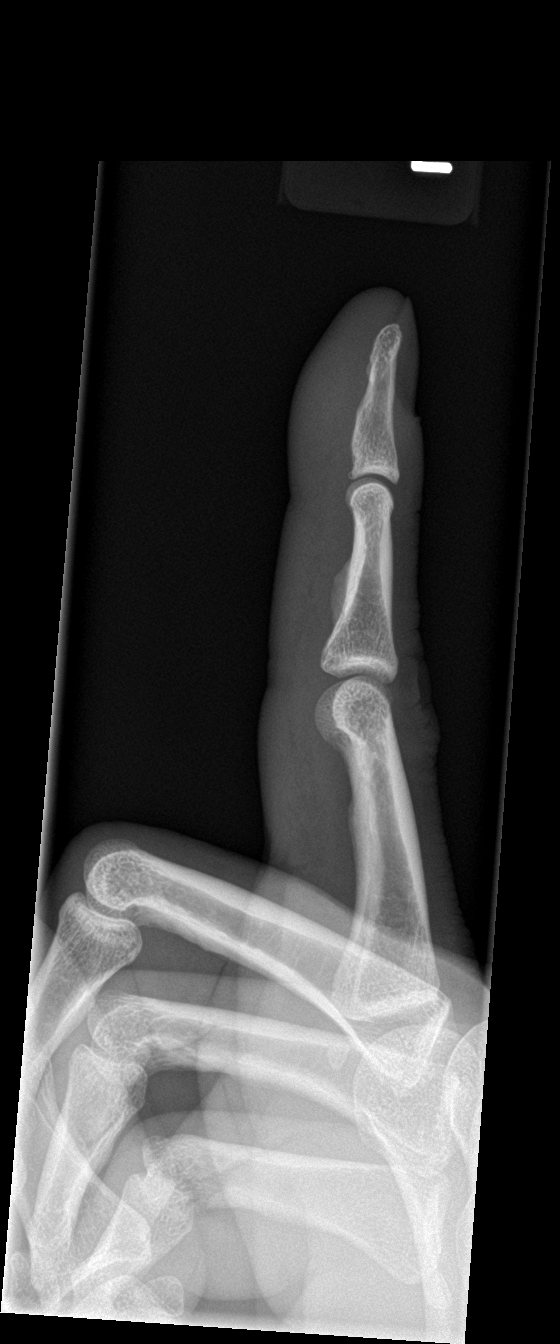

[3 of 3 positions shown; findings below may reference images not displayed]

FINDINGS: Soft tissue swelling within the distal left index finger. No visible
radiopaque foreign body. No fracture, subluxation or dislocation.
IMPRESSION: Soft tissue swelling at the tip of the left index finger. No
radiopaque foreign body or acute bony abnormality.

## 2020-03-31 ENCOUNTER — Encounter (HOSPITAL_BASED_OUTPATIENT_CLINIC_OR_DEPARTMENT_OTHER): Payer: Self-pay

## 2020-03-31 ENCOUNTER — Other Ambulatory Visit: Payer: Self-pay

## 2020-03-31 ENCOUNTER — Emergency Department (HOSPITAL_BASED_OUTPATIENT_CLINIC_OR_DEPARTMENT_OTHER)
Admission: EM | Admit: 2020-03-31 | Discharge: 2020-03-31 | Disposition: A | Discharge status: Home / Self Care | Payer: Self-pay | Attending: Emergency Medicine | Admitting: Emergency Medicine

## 2020-03-31 DIAGNOSIS — I10 Essential (primary) hypertension: Secondary | ICD-10-CM | POA: Insufficient documentation

## 2020-03-31 DIAGNOSIS — M791 Myalgia, unspecified site: Secondary | ICD-10-CM | POA: Insufficient documentation

## 2020-03-31 DIAGNOSIS — Z20822 Contact with and (suspected) exposure to covid-19: Secondary | ICD-10-CM | POA: Insufficient documentation

## 2020-03-31 DIAGNOSIS — R05 Cough: Secondary | ICD-10-CM | POA: Insufficient documentation

## 2020-03-31 DIAGNOSIS — F17210 Nicotine dependence, cigarettes, uncomplicated: Secondary | ICD-10-CM | POA: Insufficient documentation

## 2020-03-31 DIAGNOSIS — R5383 Other fatigue: Secondary | ICD-10-CM | POA: Insufficient documentation

## 2020-03-31 DIAGNOSIS — R519 Headache, unspecified: Secondary | ICD-10-CM | POA: Insufficient documentation

## 2020-03-31 LAB — SARS CORONAVIRUS 2 BY RT PCR (HOSPITAL ORDER, PERFORMED IN ~~LOC~~ HOSPITAL LAB): SARS Coronavirus 2: NEGATIVE

## 2020-03-31 NOTE — ED Provider Notes (Signed)
[facility] Provider Note: Lowella Dell, MD, FACEP  CSN: [id number] MRN: [id number] ARRIVAL: 03/31/20 at 2123 ROOM: MHFT1/MHFT1   CHIEF COMPLAINT  Generalized Body Aches   HISTORY OF PRESENT ILLNESS  03/31/20 11:29 PM Eric Blanchard is a 39 y.o. male who complains of generalized body aches and fatigue for the past 3 days.  He characterizes the aches as generalized and rates them as an 8 out of 10.  Has also had some mild cough, headache and chills.  He denies nasal congestion, sore throat, loss of taste or smell, nausea, vomiting or diarrhea.  He is here wondering if he has Covid.   Past Medical History:  Diagnosis Date  . Hypertension     History reviewed. No pertinent surgical history.  No family history on file.  Social History   Tobacco Use  . Smoking status: Current Every Day Smoker    Packs/day: 0.50    Types: Cigarettes  . Smokeless tobacco: Never Used  Vaping Use  . Vaping Use: Never used  Substance Use Topics  . Alcohol use: Yes    Comment: occ  . Drug use: Yes    Types: Marijuana    Prior to Admission medications   Not on File    Allergies Patient has no known allergies.   REVIEW OF SYSTEMS  Negative except as noted here or in the History of Present Illness.   PHYSICAL EXAMINATION  Initial Vital Signs Blood pressure (!) 163/109, pulse 94, temperature 98.7 F (37.1 C), temperature source Oral, resp. rate 20, height 5\' 11"  (1.803 m), weight 84.8 kg, SpO2 99 %.  Examination General: Well-developed, well-nourished male in no acute distress; appearance consistent with age of record HENT: normocephalic; atraumatic Eyes: pupils equal, round and reactive to light; extraocular muscles intact Neck: supple Heart: regular rate and rhythm Lungs: clear to auscultation bilaterally Abdomen: soft; nondistended; nontender; bowel sounds present Extremities: No deformity; full range of motion; pulses normal Neurologic: Awake, alert and oriented;  motor function intact in all extremities and symmetric; no facial droop Skin: Warm and dry Psychiatric: Normal mood and affect   RESULTS  Summary of this visit's results, reviewed and interpreted by myself:   EKG Interpretation  Date/Time:    Ventricular Rate:    PR Interval:    QRS Duration:   QT Interval:    QTC Calculation:   R Axis:     Text Interpretation:        Laboratory Studies: Results for orders placed or performed during the hospital encounter of 03/31/20 (from the past 24 hour(s))  SARS Coronavirus 2 by RT PCR (hospital order, performed in [facility] hospital lab) Nasopharyngeal Nasopharyngeal Swab     Status: None   Collection Time: 03/31/20  9:41 PM   Specimen: Nasopharyngeal Swab  Result Value Ref Range   SARS Coronavirus 2 NEGATIVE NEGATIVE   Imaging Studies: No results found.  ED COURSE and MDM  Nursing notes, initial and subsequent vitals signs, including pulse oximetry, reviewed and interpreted by myself.  Vitals:   03/31/20 2139  BP: (!) 163/109  Pulse: 94  Resp: 20  Temp: 98.7 F (37.1 C)  TempSrc: Oral  SpO2: 99%  Weight: 84.8 kg  Height: 5\' 11"  (1.803 m)   Medications - No data to display  Patient has been afebrile and is negative for coronavirus.  His symptoms may represent an alternative viral illness or it could represent overwork as he states he has been working harder than usual.  PROCEDURES  Procedures   ED DIAGNOSES     ICD-10-CM   1. COVID-19 virus not detected  Z03.818        Lanice Folden, Jonny Ruiz, MD 03/31/20 2341

## 2020-03-31 NOTE — ED Triage Notes (Signed)
Pt c/o body aches, fatigue x 3 days-denies fever/cough-NAD-steady gait

## 2020-09-13 ENCOUNTER — Emergency Department (HOSPITAL_BASED_OUTPATIENT_CLINIC_OR_DEPARTMENT_OTHER): Payer: Self-pay

## 2020-09-13 ENCOUNTER — Encounter (HOSPITAL_BASED_OUTPATIENT_CLINIC_OR_DEPARTMENT_OTHER): Payer: Self-pay

## 2020-09-13 ENCOUNTER — Other Ambulatory Visit: Payer: Self-pay

## 2020-09-13 ENCOUNTER — Emergency Department (HOSPITAL_BASED_OUTPATIENT_CLINIC_OR_DEPARTMENT_OTHER)
Admission: EM | Admit: 2020-09-13 | Discharge: 2020-09-13 | Disposition: A | Discharge status: Home / Self Care | Payer: Self-pay | Attending: Emergency Medicine | Admitting: Emergency Medicine

## 2020-09-13 DIAGNOSIS — I10 Essential (primary) hypertension: Secondary | ICD-10-CM | POA: Insufficient documentation

## 2020-09-13 DIAGNOSIS — M62830 Muscle spasm of back: Secondary | ICD-10-CM | POA: Insufficient documentation

## 2020-09-13 DIAGNOSIS — F17210 Nicotine dependence, cigarettes, uncomplicated: Secondary | ICD-10-CM | POA: Insufficient documentation

## 2020-09-13 LAB — CBC
HCT: 41.4 % (ref 39.0–52.0)
Hemoglobin: 13.6 g/dL (ref 13.0–17.0)
MCH: 30 pg (ref 26.0–34.0)
MCHC: 32.9 g/dL (ref 30.0–36.0)
MCV: 91.4 fL (ref 80.0–100.0)
Platelets: 129 10*3/uL — ABNORMAL LOW (ref 150–400)
RBC: 4.53 MIL/uL (ref 4.22–5.81)
RDW: 14.2 % (ref 11.5–15.5)
WBC: 9.2 10*3/uL (ref 4.0–10.5)
nRBC: 0 % (ref 0.0–0.2)

## 2020-09-13 LAB — BASIC METABOLIC PANEL
Anion gap: 7 (ref 5–15)
BUN: 14 mg/dL (ref 6–20)
CO2: 26 mmol/L (ref 22–32)
Calcium: 8.8 mg/dL — ABNORMAL LOW (ref 8.9–10.3)
Chloride: 106 mmol/L (ref 98–111)
Creatinine, Ser: 0.95 mg/dL (ref 0.61–1.24)
GFR, Estimated: >60 mL/min (ref >60)
Glucose, Bld: 89 mg/dL (ref 70–99)
Potassium: 3.8 mmol/L (ref 3.5–5.1)
Sodium: 139 mmol/L (ref 135–145)

## 2020-09-13 LAB — TROPONIN I (HIGH SENSITIVITY): Troponin I (High Sensitivity): 3 ng/L (ref <18)

## 2020-09-13 NOTE — Discharge Instructions (Signed)
You may use over-the-counter Motrin (Ibuprofen), Acetaminophen (Tylenol), topical muscle creams such as SalonPas, Icy Hot, Bengay, etc. Please stretch, apply ice or heat (whichever helps), and have massage therapy for additional assistance.  

## 2020-09-13 NOTE — ED Provider Notes (Signed)
[facility] EMERGENCY DEPARTMENT Provider Note  CSN: [id number] Arrival date & time: 09/13/20 2023  Chief Complaint(s) Back Pain  HPI Eric Blanchard is a 40 y.o. male   The history is provided by the patient.  Back Pain Location:  Thoracic spine Quality:  Aching and cramping Pain severity:  Moderate Onset quality:  Gradual Duration:  1 day Timing:  Constant Progression:  Partially resolved Chronicity:  Recurrent Context: twisting   Relieved by:  OTC medications (symethacone) Worsened by:  Movement, twisting and touching Associated symptoms: no abdominal pain and no chest pain    Reports that he noticed the pain more after he smoked a blunt.  Past Medical History Past Medical History:  Diagnosis Date  . Hypertension    There are no problems to display for this patient.  Home Medication(s) Prior to Admission medications   Not on File                                                                                                                                    Past Surgical History History reviewed. No pertinent surgical history. Family History History reviewed. No pertinent family history.  Social History Social History   Tobacco Use  . Smoking status: Current Every Day Smoker    Packs/day: 0.50    Types: Cigarettes  . Smokeless tobacco: Never Used  Vaping Use  . Vaping Use: Never used  Substance Use Topics  . Alcohol use: Yes    Comment: occ  . Drug use: Yes    Types: Marijuana   Allergies Patient has no known allergies.  Review of Systems Review of Systems  Cardiovascular: Negative for chest pain.  Gastrointestinal: Negative for abdominal pain.  Musculoskeletal: Positive for back pain.   All other systems are reviewed and are negative for acute change except as noted in the HPI  Physical Exam Vital Signs  I have reviewed the triage vital signs BP (!) 149/100   Pulse 60   Temp 98.5 F (36.9 C) (Oral)   Resp 15   Ht 5\' 11"  (1.803  m)   Wt 81.6 kg   SpO2 99%   BMI 25.10 kg/m   Physical Exam Vitals reviewed.  Constitutional:      General: He is not in acute distress.    Appearance: He is well-developed and well-nourished. He is not diaphoretic.  HENT:     Head: Normocephalic and atraumatic.     Nose: Nose normal.  Eyes:     General: No scleral icterus.       Right eye: No discharge.        Left eye: No discharge.     Extraocular Movements: EOM normal.     Conjunctiva/sclera: Conjunctivae normal.     Pupils: Pupils are equal, round, and reactive to light.  Cardiovascular:     Rate and Rhythm: Normal rate and regular rhythm.     Heart  sounds: No murmur heard. No friction rub. No gallop.   Pulmonary:     Effort: Pulmonary effort is normal. No respiratory distress.     Breath sounds: Normal breath sounds. No stridor. No rales.  Abdominal:     General: There is no distension.     Palpations: Abdomen is soft.     Tenderness: There is no abdominal tenderness.  Musculoskeletal:        General: No edema.     Cervical back: Normal range of motion and neck supple.     Thoracic back: Spasms and tenderness present.       Back:  Skin:    General: Skin is warm and dry.     Findings: No erythema or rash.  Neurological:     Mental Status: He is alert and oriented to person, place, and time.  Psychiatric:        Mood and Affect: Mood and affect normal.     ED Results and Treatments Labs (all labs ordered are listed, but only abnormal results are displayed) Labs Reviewed  BASIC METABOLIC PANEL - Abnormal; Notable for the following components:      Result Value   Calcium 8.8 (*)    All other components within normal limits  CBC - Abnormal; Notable for the following components:   Platelets 129 (*)    All other components within normal limits  TROPONIN I (HIGH SENSITIVITY)                                                                                                                         EKG  EKG  Interpretation  Date/Time:  Tuesday September 13 2020 20:41:14 EST Ventricular Rate:  80 PR Interval:  138 QRS Duration: 90 QT Interval:  380 QTC Calculation: 438 R Axis:   75 Text Interpretation: Normal sinus rhythm Normal ECG No acute changes Confirmed by Drema Pry [phone]) on 09/13/2020 11:08:31 PM      Radiology DG Chest 2 View  Result Date: 09/13/2020 CLINICAL DATA:  40 year old male with chest pain. EXAM: CHEST - 2 VIEW COMPARISON:  None. FINDINGS: The lungs are clear. There is no pleural effusion pneumothorax. The cardiac silhouette is within limits. No acute osseous pathology. IMPRESSION: No active cardiopulmonary disease. Electronically Signed   By: Elgie Collard M.D.   On: 09/13/2020 21:30    Pertinent labs & imaging results that were available during my care of the patient were reviewed by me and considered in my medical decision making (see chart for details).  Medications Ordered in ED Medications - No data to display  Procedures Procedures  (including critical care time)  Medical Decision Making / ED Course I have reviewed the nursing notes for this encounter and the patient's prior records (if available in EHR or on provided paperwork).   Eric Blanchard was evaluated in Emergency Department on 09/14/2020 for the symptoms described in the history of present illness. He was evaluated in the context of the global COVID-19 pandemic, which necessitated consideration that the patient might be at risk for infection with the SARS-CoV-2 virus that causes COVID-19. Institutional protocols and algorithms that pertain to the evaluation of patients at risk for COVID-19 are in a state of rapid change based on information released by regulatory bodies including the CDC and federal and state organizations. These policies and algorithms were followed during  the patient's care in the ED.  Upper back pain.  Patient denied any chest pain. He is got a spastic right parascapular muscle which is tender to palpation reproducing his pain. Work-up in triage included labs ruling out ACS. Without acute ischemic changes or evidence of pericarditis. Chest x-ray without evidence suggestive of pneumonia, pneumothorax, pneumomediastinum.  No abnormal contour of the mediastinum to suggest dissection. No evidence of acute injuries. Doubt PE. Presentation not classic for aortic dissection or esophageal perforation.  Appears to be muscular in nature. Recommended supportive and symptomatic management.      Final Clinical Impression(s) / ED Diagnoses Final diagnoses:  Muscle spasm of back   The patient appears reasonably screened and/or stabilized for discharge and I doubt any other medical condition or other [facility] requiring further screening, evaluation, or treatment in the ED at this time prior to discharge. Safe for discharge with strict return precautions.  Disposition: Discharge  Condition: Good  I have discussed the results, Dx and Tx plan with the patient/family who expressed understanding and agree(s) with the plan. Discharge instructions discussed at length. The patient/family was given strict return precautions who verbalized understanding of the instructions. No further questions at time of discharge.    ED Discharge Orders    None      Follow Up: Primary care provider  Schedule an appointment as soon as possible for a visit        This chart was dictated using voice recognition software.  Despite best efforts to proofread,  errors can occur which can change the documentation meaning.   Nira Conn, MD 09/14/20 [phone]

## 2020-09-13 NOTE — ED Triage Notes (Addendum)
Began charting under wrong patient; disregard other documentation with initials KB

## 2020-09-13 NOTE — ED Triage Notes (Addendum)
Pt states he was smoking a blunt when he started having pain in his right/mid back. Felt like he had "gas pains". Pain worse with deep breath. States he has felt this pain before when he was smoking too many cigars. States his breathing feels off

## 2022-06-10 ENCOUNTER — Other Ambulatory Visit: Payer: Self-pay

## 2022-06-10 ENCOUNTER — Emergency Department (HOSPITAL_BASED_OUTPATIENT_CLINIC_OR_DEPARTMENT_OTHER)
Admission: EM | Admit: 2022-06-10 | Discharge: 2022-06-10 | Disposition: A | Discharge status: Home / Self Care | Payer: Self-pay | Attending: Emergency Medicine | Admitting: Emergency Medicine

## 2022-06-10 ENCOUNTER — Telehealth (HOSPITAL_BASED_OUTPATIENT_CLINIC_OR_DEPARTMENT_OTHER): Payer: Self-pay | Admitting: Emergency Medicine

## 2022-06-10 ENCOUNTER — Encounter (HOSPITAL_BASED_OUTPATIENT_CLINIC_OR_DEPARTMENT_OTHER): Payer: Self-pay | Admitting: Emergency Medicine

## 2022-06-10 DIAGNOSIS — I10 Essential (primary) hypertension: Secondary | ICD-10-CM | POA: Insufficient documentation

## 2022-06-10 DIAGNOSIS — F17210 Nicotine dependence, cigarettes, uncomplicated: Secondary | ICD-10-CM | POA: Insufficient documentation

## 2022-06-10 DIAGNOSIS — S0502XA Injury of conjunctiva and corneal abrasion without foreign body, left eye, initial encounter: Secondary | ICD-10-CM | POA: Insufficient documentation

## 2022-06-10 DIAGNOSIS — W228XXA Striking against or struck by other objects, initial encounter: Secondary | ICD-10-CM | POA: Insufficient documentation

## 2022-06-10 MED ORDER — ERYTHROMYCIN 5 MG/GM OP OINT
TOPICAL_OINTMENT | Freq: Four times a day (QID) | OPHTHALMIC | Status: DC
Start: 2022-06-10 — End: 2022-06-10
  Administered 2022-06-10: 1 via OPHTHALMIC
  Filled 2022-06-10: qty 3.5

## 2022-06-10 MED ORDER — OXYCODONE-ACETAMINOPHEN 5-325 MG PO TABS
1.0000 | ORAL_TABLET | Freq: Once | ORAL | Status: AC
  Administered 2022-06-10: 1 via ORAL
  Filled 2022-06-10: qty 1

## 2022-06-10 MED ORDER — TETRACAINE HCL 0.5 % OP SOLN
2.0000 [drp] | Freq: Once | OPHTHALMIC | Status: AC
  Administered 2022-06-10: 2 [drp] via OPHTHALMIC
  Filled 2022-06-10: qty 4

## 2022-06-10 MED ORDER — OXYCODONE-ACETAMINOPHEN 5-325 MG PO TABS: 1.0000 | ORAL_TABLET | ORAL | 0 refills | Status: DC | PRN

## 2022-06-10 MED ORDER — FLUORESCEIN SODIUM 1 MG OP STRP
1.0000 | ORAL_STRIP | Freq: Once | OPHTHALMIC | Status: AC
  Administered 2022-06-10: 1 via OPHTHALMIC
  Filled 2022-06-10: qty 1

## 2022-06-10 MED ORDER — OXYCODONE-ACETAMINOPHEN 5-325 MG PO TABS: 1.0000 | ORAL_TABLET | ORAL | 0 refills | Status: AC | PRN

## 2022-06-10 NOTE — Telephone Encounter (Signed)
Resent meds.

## 2022-06-10 NOTE — ED Triage Notes (Signed)
Pt states he was hit in the L eye with a piece of metal when trying to set a mouse trap yesterday. He states he woke up to use the bathroom and his eye still was hurting, so he came to ED.

## 2022-06-10 NOTE — Telephone Encounter (Signed)
Pain medication that was prescribed earlier rerouted to different pharmacy.

## 2022-06-10 NOTE — ED Provider Notes (Signed)
[facility] Provider Note: Lowella Dell, MD, FACEP  CSN: [id number] MRN: [id number] ARRIVAL: 06/10/22 at 0344 ROOM: MH01/MH01   CHIEF COMPLAINT  Eye Injury   HISTORY OF PRESENT ILLNESS  06/10/22 4:21 AM Eric Blanchard is a 41 y.o. male who was hit in the left eye with a piece of metal while trying to set a mouse trap yesterday.  He woke up to use the bathroom this morning and his left eye was still hurting so he came to the ED.  He rates his pain as an 8 out of 10, worse with exposure to light.    Past Medical History:  Diagnosis Date   Hypertension     History reviewed. No pertinent surgical history.  History reviewed. No pertinent family history.  Social History   Tobacco Use   Smoking status: Every Day    Packs/day: 0.50    Types: Cigarettes   Smokeless tobacco: Never  Vaping Use   Vaping Use: Never used  Substance Use Topics   Alcohol use: Yes    Comment: occ   Drug use: Yes    Types: Marijuana    Prior to Admission medications   Medication Sig Start Date End Date Taking? Authorizing Provider  oxyCODONE-acetaminophen (PERCOCET) 5-325 MG tablet Take 1 tablet by mouth every 4 (four) hours as needed for severe pain. 06/10/22  Yes Florrie Ramires, MD    Allergies Patient has no known allergies.   REVIEW OF SYSTEMS  Negative except as noted here or in the History of Present Illness.   PHYSICAL EXAMINATION  Initial Vital Signs Blood pressure (!) 153/114, pulse 70, temperature 98.1 F (36.7 C), temperature source Oral, resp. rate 18, height 5\' 11"  (1.803 m), weight 87.5 kg, SpO2 99 %.  Examination General: Well-developed, well-nourished male in no acute distress; appearance consistent with age of record HENT: normocephalic; atraumatic Eyes: pupils equal, round and reactive to light; extraocular muscles intact; photophobia; left conjunctival injection; no hyphema; no subconjunctival hemorrhage; abrasion of left cornea seen at 4:00 on fluorescein  exam Neck: supple Heart: regular rate and rhythm Lungs: clear to auscultation bilaterally Abdomen: soft; nondistended; nontender; bowel sounds present Extremities: No deformity; full range of motion Neurologic: Awake, alert and oriented; motor function intact in all extremities and symmetric; no facial droop Skin: Warm and dry Psychiatric: Normal mood and affect   RESULTS  Summary of this visit's results, reviewed and interpreted by myself:   EKG Interpretation  Date/Time:    Ventricular Rate:    PR Interval:    QRS Duration:   QT Interval:    QTC Calculation:   R Axis:     Text Interpretation:         Laboratory Studies: No results found for this or any previous visit (from the past 24 hour(s)). Imaging Studies: No results found.  ED COURSE and MDM  Nursing notes, initial and subsequent vitals signs, including pulse oximetry, reviewed and interpreted by myself.  Vitals:   06/10/22 0352 06/10/22 0354  BP:  (!) 153/114  Pulse:  70  Resp:  18  Temp:  98.1 F (36.7 C)  TempSrc:  Oral  SpO2:  99%  Weight: 87.5 kg   Height: 5\' 11"  (1.803 m)    Medications  tetracaine (PONTOCAINE) 0.5 % ophthalmic solution 2 drop (has no administration in time range)  fluorescein ophthalmic strip 1 strip (has no administration in time range)  oxyCODONE-acetaminophen (PERCOCET/ROXICET) 5-325 MG per tablet 1 tablet (has no administration in time  range)  erythromycin ophthalmic ointment (has no administration in time range)    Examination consistent with corneal abrasion.  No evidence of hyphema.  Patient got significant transient relief with tetracaine drops.  We will treat him with analgesics and topical ophthalmic ointment and refer to ophthalmology for thorough work-up tomorrow.  PROCEDURES  Procedures   ED DIAGNOSES     ICD-10-CM   1. Abrasion of left cornea, initial encounter  S05.Blanca Friend, MD 06/10/22 [phone]

## 2022-11-04 ENCOUNTER — Encounter (HOSPITAL_BASED_OUTPATIENT_CLINIC_OR_DEPARTMENT_OTHER): Payer: Self-pay

## 2022-11-04 ENCOUNTER — Emergency Department (HOSPITAL_BASED_OUTPATIENT_CLINIC_OR_DEPARTMENT_OTHER): Payer: BLUE CROSS/BLUE SHIELD

## 2022-11-04 ENCOUNTER — Emergency Department (HOSPITAL_BASED_OUTPATIENT_CLINIC_OR_DEPARTMENT_OTHER)
Admission: EM | Admit: 2022-11-04 | Discharge: 2022-11-04 | Disposition: A | Discharge status: Home / Self Care | Payer: BLUE CROSS/BLUE SHIELD | Attending: Emergency Medicine | Admitting: Emergency Medicine

## 2022-11-04 DIAGNOSIS — M7661 Achilles tendinitis, right leg: Secondary | ICD-10-CM | POA: Diagnosis not present

## 2022-11-04 DIAGNOSIS — M25571 Pain in right ankle and joints of right foot: Secondary | ICD-10-CM | POA: Diagnosis present

## 2022-11-04 DIAGNOSIS — M7660 Achilles tendinitis, unspecified leg: Secondary | ICD-10-CM

## 2022-11-04 MED ORDER — NAPROXEN 500 MG PO TABS: 500.0000 mg | ORAL_TABLET | Freq: Two times a day (BID) | ORAL | 0 refills | Status: AC

## 2022-11-04 MED ORDER — LIDOCAINE 5 % EX PTCH
1.0000 | MEDICATED_PATCH | CUTANEOUS | 0 refills | Status: AC
Start: 2022-11-04 — End: ?
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
## 2022-11-04 NOTE — Discharge Instructions (Signed)
Follow up With orthopedics call to schedule an appointment
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
